# Patient Record
Sex: Female | Born: 1980 | Hispanic: Yes | Marital: Single | Smoking: Former smoker
Health system: Southern US, Community
[De-identification: ages and names within clinical notes are randomized; demographics above are authoritative.]

## PROBLEM LIST (undated history)

## (undated) ENCOUNTER — Inpatient Hospital Stay (HOSPITAL_COMMUNITY): Payer: Self-pay

## (undated) DIAGNOSIS — R002 Palpitations: Secondary | ICD-10-CM

## (undated) DIAGNOSIS — N200 Calculus of kidney: Secondary | ICD-10-CM

## (undated) DIAGNOSIS — O26839 Pregnancy related renal disease, unspecified trimester: Secondary | ICD-10-CM

## (undated) HISTORY — PX: OTHER SURGICAL HISTORY: SHX169

## (undated) HISTORY — PX: DILATION AND CURETTAGE OF UTERUS: SHX78

---

## 2013-05-19 ENCOUNTER — Emergency Department (HOSPITAL_COMMUNITY)
Admission: EM | Admit: 2013-05-19 | Discharge: 2013-05-19 | Disposition: A | Discharge status: Home / Self Care | Payer: Medicaid Other | Source: Home / Self Care | Attending: Family Medicine | Admitting: Family Medicine

## 2013-05-19 ENCOUNTER — Encounter (HOSPITAL_COMMUNITY): Payer: Self-pay | Admitting: Emergency Medicine

## 2013-05-19 DIAGNOSIS — B356 Tinea cruris: Secondary | ICD-10-CM

## 2013-05-19 MED ORDER — CLOTRIMAZOLE-BETAMETHASONE 1-0.05 % EX CREA: TOPICAL_CREAM | CUTANEOUS | Status: DC

## 2013-05-19 NOTE — ED Notes (Signed)
Pt c/o rash/blisters in groin area/inner thighs onset this am Reports it is painful/burning to walk and feels like blisters are "raw" Recalls bathing in baby oil last night... Denies: f/v/n/d She is alert w/no signs of acute distress.

## 2013-05-19 NOTE — ED Provider Notes (Signed)
CSN: [id number]     Arrival date & time 05/19/13  1624 History   First MD Initiated Contact with Patient 05/19/13 1802     Chief Complaint  Patient presents with  . Rash   (Consider location/radiation/quality/duration/timing/severity/associated sxs/prior Treatment) Patient is a 32 y.o. female presenting with rash. The history is provided by the patient.  Rash Location: bilateral groin folds. Quality: burning, itchiness, redness and weeping   Quality: not blistering and not draining   Severity:  Moderate Onset quality:  Gradual Duration:  2 days Timing:  Constant Progression:  Worsening Chronicity:  New Ineffective treatments:  Anti-itch cream   History reviewed. No pertinent past medical history. Past Surgical History  Procedure Laterality Date  . Cesarean section     No family history on file. History  Substance Use Topics  . Smoking status: Current Every Day Smoker -- 1.00 packs/day    Types: Cigarettes  . Smokeless tobacco: Not on file  . Alcohol Use: No   OB History   Grav Para Term Preterm Abortions TAB SAB Ect Mult Living                 Review of Systems  Skin: Positive for rash.  All other systems reviewed and are negative.    Allergies  Penicillins  Home Medications   Current Outpatient Rx  Name  Route  Sig  Dispense  Refill  . clotrimazole-betamethasone (LOTRISONE) cream      Apply to affected area 2 times daily x 7 days   15 g   0    BP 108/81  Pulse 86  Temp(Src) 97.8 F (36.6 C) (Oral)  Resp 18  SpO2 97% Physical Exam  Nursing note and vitals reviewed. Constitutional: She is oriented to person, place, and time. She appears well-developed and well-nourished. No distress.  HENT:  Head: Normocephalic and atraumatic.  Eyes: Conjunctivae are normal.  Neck: Normal range of motion.  Cardiovascular: Normal rate.   Pulmonary/Chest: Effort normal.  Genitourinary: Vagina normal.  Musculoskeletal: Normal range of motion.  Neurological:  She is alert and oriented to person, place, and time.  Skin: Skin is warm and dry. Rash noted. There is erythema.  Erythematous macular confluent rash in bilateral groin folds and perineal area. Mild swelling. No blistering.   Psychiatric: She has a normal mood and affect. Her behavior is normal.    ED Course  Procedures (including critical care time) Labs Review Labs Reviewed - No data to display Imaging Review No results found.  EKG Interpretation    Date/Time:    Ventricular Rate:    PR Interval:    QRS Duration:   QT Interval:    QTC Calculation:   R Axis:     Text Interpretation:              MDM   1. Tinea cruris   Exam suggests tinea cruris. Advised to keep area as clean and dry as possible. Begin treating with Lotrisone and return if no improvement.    Jess Barters [place], [place] 05/19/13 [phone]

## 2013-05-20 NOTE — ED Provider Notes (Signed)
Medical screening examination/treatment/procedure(s) were performed by resident physician or non-physician practitioner and as supervising physician I was immediately available for consultation/collaboration.   Ketzia Guzek DOUGLAS MD.   Royetta Probus D Hersel Mcmeen, MD 05/20/13 1703 

## 2013-08-14 ENCOUNTER — Encounter (HOSPITAL_COMMUNITY): Payer: Self-pay | Admitting: Emergency Medicine

## 2013-08-14 ENCOUNTER — Emergency Department (HOSPITAL_COMMUNITY): Payer: Medicaid Other

## 2013-08-14 DIAGNOSIS — Y9389 Activity, other specified: Secondary | ICD-10-CM | POA: Insufficient documentation

## 2013-08-14 DIAGNOSIS — F17200 Nicotine dependence, unspecified, uncomplicated: Secondary | ICD-10-CM | POA: Insufficient documentation

## 2013-08-14 DIAGNOSIS — R209 Unspecified disturbances of skin sensation: Secondary | ICD-10-CM | POA: Insufficient documentation

## 2013-08-14 DIAGNOSIS — S93519A Sprain of interphalangeal joint of unspecified toe(s), initial encounter: Secondary | ICD-10-CM | POA: Insufficient documentation

## 2013-08-14 DIAGNOSIS — S90129A Contusion of unspecified lesser toe(s) without damage to nail, initial encounter: Secondary | ICD-10-CM | POA: Insufficient documentation

## 2013-08-14 DIAGNOSIS — Z880 Allergy status to penicillin: Secondary | ICD-10-CM | POA: Insufficient documentation

## 2013-08-14 DIAGNOSIS — W2209XA Striking against other stationary object, initial encounter: Secondary | ICD-10-CM | POA: Insufficient documentation

## 2013-08-14 DIAGNOSIS — Y929 Unspecified place or not applicable: Secondary | ICD-10-CM | POA: Insufficient documentation

## 2013-08-14 NOTE — ED Notes (Signed)
Pt. accidentally hit her right 2nd toe against an aluminum pole while sliding at child's birthday party this evening . Presents with pain /slight swelling of right 2nd toe.

## 2013-08-15 ENCOUNTER — Emergency Department (HOSPITAL_COMMUNITY)
Admission: EM | Admit: 2013-08-15 | Discharge: 2013-08-15 | Disposition: A | Discharge status: Home / Self Care | Payer: Medicaid Other | Attending: Emergency Medicine | Admitting: Emergency Medicine

## 2013-08-15 DIAGNOSIS — S93514A Sprain of interphalangeal joint of right lesser toe(s), initial encounter: Secondary | ICD-10-CM

## 2013-08-15 MED ORDER — HYDROCODONE-ACETAMINOPHEN 5-325 MG PO TABS: 1.0000 | ORAL_TABLET | Freq: Four times a day (QID) | ORAL | Status: DC | PRN

## 2013-08-15 MED ORDER — IBUPROFEN 800 MG PO TABS: 800.0000 mg | ORAL_TABLET | Freq: Three times a day (TID) | ORAL | Status: DC

## 2013-08-15 NOTE — ED Notes (Signed)
[place], PA at bedside 

## 2013-08-15 NOTE — Discharge Instructions (Signed)
Sprain A sprain is an injury to the soft tissue that connects adjacent bones across a joint (ligament), in which the ligament becomes stretched or torn. The purpose of ligaments is to prevent a joint from moving out side of its intended range of motion. The most common joints of the body to suffer from a sprain are the ankles, knees, and fingers. Sprains are classified into 3 categories: grade 1, grade 2, and grade 3. Grade 1 sprains cause pain, but the tendon is not lengthened. Grade 2 sprains include a lengthened ligament due to the ligament being stretched or partially ruptured. With grade 2 sprains there is still function, although the function may be diminished. Grade 3 sprains are marked by a complete tear of the ligament and the joint usually displays a loss of function.  SYMPTOMS   Pain and tenderness in the area of injury; severity varies with extent of injury.  Swelling of the affected joint (usually).  Redness or bruising in the area of injury, either immediately or several hours after the injury.  Loss of normal mobility of the injured joint. CAUSES  A sprain may occur as a secondary injury to a traumatic event, such as a fall or twisting injury. The ankle is susceptible to sprains because of it is a mechanically weak joint and is exposed during athletic events. RISK INCREASES WITH:  Trauma, especially with high-risk activities, such as sports with a lot of jumping, for knee and ankle sprains (basketball or volleyball); sports with a lot of pivoting motions, for knee sprains (skiing, soccer, or football); and contact sports.  Falls onto outstretched hands and wrists (wrist sprains).  Catching sports, such as water polo and baseball (finger sprains).  Poorly fitting and high-heeled shoes.  Poor field conditions.  Poor strength and flexibility.  Failure to warm-up properly before activity. PREVENTION  Warm up and stretch properly before activity.  Maintain physical  fitness:  Muscle strength.  Endurance and flexibility.  Cardiovascular fitness.  Wear properly fitted and padded protective equipment.  Wrap weak joints with support bandages before strenuous activity. PROGNOSIS  If treated properly, sprains usually heal in 2 to 8 weeks. Occasionally sprains require surgery for healing to occur. RELATED COMPLICATIONS  Permanent instability of a joint if the sprain is severe or if a ligament is repeatedly sprained.  Arthritis of the joint. TREATMENT Treatment involves ice and medicine to relieve pain and inflammation. Rest and immobilization of the injured joint is necessary for healing to occur. Strengthening and stretching exercises may be recommended after immobilization to regain strength and a full range of motion. For severe sprains surgery may be necessary to repair the injured ligament. MEDICATION  If pain medicine is necessary, then nonsteroidal anti-inflammatory medicines, such as aspirin and ibuprofen, or other minor pain relievers, such as acetaminophen, are often recommended.  Do not take pain medicine for 7 days before surgery.  Prescription pain relievers may be prescribed. Use only as directed and only as much as you need.  Cortisone injections are generally not advised for sprains. Cortisone may affect the healing of the ligament. HEAT AND COLD  Cold treatment (icing) relieves pain and reduces inflammation. Cold treatment should be applied for 10 to 15 minutes every 2 to 3 hours for inflammation and pain and immediately after any activity that aggravates your symptoms. Use ice packs or massage the area with a piece of ice (ice massage).  Heat treatment may be used prior to performing the stretching and strengthening activities prescribed by your   caregiver, physical therapist, or athletic trainer. Use a heat pack or soak the injury in warm water. SEEK MEDICAL CARE IF:  Symptoms get worse or do not improve in 2 to 6 weeks despite  treatment. Document Released: 05/19/2005 Document Revised: 08/11/2011 Document Reviewed: 08/31/2008 Exit[employer] Patient Information 2014 [employer].  

## 2013-08-15 NOTE — ED Provider Notes (Signed)
CSN: [id number]     Arrival date & time 08/14/13  2250 History   First MD Initiated Contact with Patient 08/15/13 0030     Chief Complaint  Patient presents with  . Toe Injury     (Consider location/radiation/quality/duration/timing/severity/associated sxs/prior Treatment) The history is provided by the patient. No language interpreter was used.    33 year old female presents for evaluation of toe injury.  Pt report she accidentally hits her right 2nd toe against an aluminum pole while sliding at child's birthday party this evening.  Report acute onset of sharp throbbing, non radiating pain to affected toe, worsening with palpation or walking.  She took some ibuprofen prior to arrival which provides minimal relief. Pain is now 8/10.  No ankle or knee pain, no other injury.    History reviewed. No pertinent past medical history. Past Surgical History  Procedure Laterality Date  . Cesarean section     No family history on file. History  Substance Use Topics  . Smoking status: Current Every Day Smoker -- 1.00 packs/day    Types: Cigarettes  . Smokeless tobacco: Not on file  . Alcohol Use: No   OB History   Grav Para Term Preterm Abortions TAB SAB Ect Mult Living                 Review of Systems  Constitutional: Negative for fever.  Musculoskeletal: Positive for arthralgias.  Skin: Negative for rash and wound.  Neurological: Positive for numbness.      Allergies  Penicillins  Home Medications   Current Outpatient Rx  Name  Route  Sig  Dispense  Refill  . clotrimazole-betamethasone (LOTRISONE) cream      Apply to affected area 2 times daily x 7 days   15 g   0    BP 96/76  Pulse 106  Temp(Src) 98.4 F (36.9 C) (Oral)  Resp 18  SpO2 99% Physical Exam  Nursing note and vitals reviewed. Constitutional: She appears well-developed and well-nourished. No distress.  HENT:  Head: Atraumatic.  Eyes: Conjunctivae are normal.  Neck: Neck supple.  Musculoskeletal:  She exhibits tenderness (R foot: 2nd toe moderately tender to palpation, small hematoma and swelling noted at PIP, brisk cap refill, no deformity.  ttp).  R ankle with FROM, nontender  Neurological: She is alert.  Skin: No rash noted.  Psychiatric: She has a normal mood and affect.    ED Course  Procedures (including critical care time)  12:43 AM Pt injured right 2nd toe.  No acute fx on xray.  Likely sprain.  Will provide postop shoe, RICE and return precaution  Labs Review Labs Reviewed - No data to display Imaging Review Dg Toe 2nd Right  08/14/2013   CLINICAL DATA:  Injury.  Right second toe pain.  EXAM: RIGHT SECOND TOE  COMPARISON:  None.  FINDINGS: Imaged bones, joints and soft tissues appear normal.  IMPRESSION: Negative exam.   Electronically Signed   By: Drusilla Kannerhomas  Dalessio M.D.   On: 08/14/2013 23:29     EKG Interpretation None      MDM   Final diagnoses:  Sprain, IP, toe, second, right    BP 96/76  Pulse 106  Temp(Src) 98.4 F (36.9 C) (Oral)  Resp 18  SpO2 99%  I have reviewed nursing notes and vital signs. I personally reviewed the imaging tests through PACS system  I reviewed available ER/hospitalization records thought the EMR     Fayrene HelperBowie Tanicia Wolaver, [place] 08/15/13 [phone]

## 2013-08-18 NOTE — ED Provider Notes (Signed)
Medical screening examination/treatment/procedure(s) were performed by non-physician practitioner and as supervising physician I was immediately available for consultation/collaboration.   EKG Interpretation None       Zahriyah Joo, MD 08/18/13 0236 

## 2014-06-26 ENCOUNTER — Emergency Department (HOSPITAL_COMMUNITY)
Admission: EM | Admit: 2014-06-26 | Discharge: 2014-06-26 | Disposition: A | Discharge status: Home / Self Care | Payer: Medicaid Other | Source: Home / Self Care | Attending: Emergency Medicine | Admitting: Emergency Medicine

## 2014-06-26 ENCOUNTER — Encounter (HOSPITAL_COMMUNITY): Payer: Self-pay | Admitting: Emergency Medicine

## 2014-06-26 DIAGNOSIS — J209 Acute bronchitis, unspecified: Secondary | ICD-10-CM | POA: Diagnosis not present

## 2014-06-26 DIAGNOSIS — M545 Low back pain, unspecified: Secondary | ICD-10-CM

## 2014-06-26 MED ORDER — KETOROLAC TROMETHAMINE 60 MG/2ML IM SOLN
60.0000 mg | Freq: Once | INTRAMUSCULAR | Status: AC
  Administered 2014-06-26: 60 mg via INTRAMUSCULAR

## 2014-06-26 MED ORDER — HYDROCODONE-ACETAMINOPHEN 5-325 MG PO TABS: 1.0000 | ORAL_TABLET | Freq: Four times a day (QID) | ORAL | Status: DC | PRN

## 2014-06-26 MED ORDER — PREDNISONE 50 MG PO TABS
ORAL_TABLET | ORAL | Status: DC
Start: 2014-06-26 — End: 2015-10-23

## 2014-06-26 MED ORDER — KETOROLAC TROMETHAMINE 60 MG/2ML IM SOLN
INTRAMUSCULAR | Status: AC
  Filled 2014-06-26: qty 2

## 2014-06-26 MED ORDER — METHYLPREDNISOLONE SODIUM SUCC 125 MG IJ SOLR
80.0000 mg | Freq: Once | INTRAMUSCULAR | Status: AC
  Administered 2014-06-26: 80 mg via INTRAMUSCULAR

## 2014-06-26 MED ORDER — METHYLPREDNISOLONE SODIUM SUCC 125 MG IJ SOLR
INTRAMUSCULAR | Status: AC
Start: 2014-06-26 — End: 2014-06-26
  Filled 2014-06-26: qty 2

## 2014-06-26 MED ORDER — IBUPROFEN 800 MG PO TABS: 800.0000 mg | ORAL_TABLET | Freq: Three times a day (TID) | ORAL | Status: DC

## 2014-06-26 MED ORDER — AZITHROMYCIN 250 MG PO TABS: ORAL_TABLET | ORAL | Status: DC

## 2014-06-26 NOTE — ED Provider Notes (Signed)
CSN: [id number]     Arrival date & time 06/26/14  1139 History   First MD Initiated Contact with Patient 06/26/14 1345     Chief Complaint  Patient presents with  . Chest Pain  . Cough   (Consider location/radiation/quality/duration/timing/severity/associated sxs/prior Treatment) HPI  She is a 34 year old woman here for evaluation of cough and low back pain.  The cough started 2 days ago. It is associated with some mild nasal congestion. She reports pain and tightness throughout her chest with the cough. She will get slightly short of breath with talking. No fevers or chills.  She is having low back pain for the last 2 weeks or so. She states it initially started in her lateral hip 1-2 months ago. That seems to is improved, but now she has pain bilateral lower back. It is worse on going from sitting to standing. It feels the best when she is standing up straight. There is no radiating pain. No numbness, tingling, weakness in her lower extremities. No bowel or bladder incontinence.  History reviewed. No pertinent past medical history. Past Surgical History  Procedure Laterality Date  . Cesarean section     No family history on file. History  Substance Use Topics  . Smoking status: Current Every Day Smoker -- 1.00 packs/day    Types: Cigarettes  . Smokeless tobacco: Not on file  . Alcohol Use: Yes   OB History    No data available     Review of Systems As in history of present illness Allergies  Penicillins  Home Medications   Prior to Admission medications   Medication Sig Start Date End Date Taking? Authorizing Provider  azithromycin (ZITHROMAX Z-PAK) 250 MG tablet Take 2 pills today, then 1 pill daily until gone 06/26/14   Charm RingsErin J Kaelyn Nauta, MD  clotrimazole-betamethasone (LOTRISONE) cream Apply to affected area 2 times daily x 7 days 05/19/13   Ria ClockJennifer Lee H Presson, PA  HYDROcodone-acetaminophen (NORCO/VICODIN) 5-325 MG per tablet Take 1 tablet by mouth every 6 (six) hours  as needed for severe pain. 06/26/14   Charm RingsErin J Damara Klunder, MD  ibuprofen (ADVIL,MOTRIN) 800 MG tablet Take 1 tablet (800 mg total) by mouth 3 (three) times daily. 06/26/14   Charm RingsErin J Rubyann Lingle, MD  predniSONE (DELTASONE) 50 MG tablet Take 1 pill daily for 5 days 06/26/14   Charm RingsErin J Eswin Worrell, MD   BP 112/82 mmHg  Pulse 81  Temp(Src) 98.8 F (37.1 C) (Oral)  Resp 16  SpO2 97% Physical Exam  Constitutional: She is oriented to person, place, and time. She appears well-developed and well-nourished. She appears distressed.  Neck: Neck supple.  Cardiovascular: Normal rate, regular rhythm and normal heart sounds.   No murmur heard. Pulmonary/Chest: Effort normal and breath sounds normal. No respiratory distress. She has no wheezes. She has no rales. She exhibits tenderness.  Lymphadenopathy:    She has no cervical adenopathy.  Neurological: She is alert and oriented to person, place, and time. She exhibits normal muscle tone.  Back: No erythema or edema. No vertebral step-offs. No vertebral tenderness. She is tender over bilateral SI joints, worse on the right. She is also tender over bilateral greater trochanters. Positive Faber bilaterally. Negative straight leg raise bilaterally. 5 out of 5 strength in bilateral lower extremities.    ED Course  Procedures (including critical care time) Labs Review Labs Reviewed - No data to display  Imaging Review No results found.   MDM   1. Acute bronchitis, unspecified organism   2.  Bilateral low back pain without sciatica    Toradol 60 mg IM and Solu-Medrol 80 mg IM given for pain.  We'll treat bronchitis with azithromycin and prednisone. We'll treat back pain conservatively with ice/heat, ibuprofen, Norco. Follow-up as needed.    Charm Rings, MD 06/26/14 [phone]

## 2014-06-26 NOTE — ED Notes (Signed)
Chest pain with coughing for 2 days.  Coughing up phlegm, congestion.  Symptoms for 2 days Lower back pain 2 weeks ago, now reports pain is moving up spine.  Reports going form sitting to standing causes extreme pain in lower back

## 2014-06-26 NOTE — Discharge Instructions (Signed)
You have bronchitis. Take prednisone and a Z-Pak as prescribed.  Your back pain is likely from SI joint inflammation. The prednisone will help with this. Take ibuprofen 800 mg 3 times a day for the next week, then as needed. Apply ice to the affected areas for 10 minutes, followed by heat for 20 minutes. Do this 2-3 times a day for the next week. Use hydrocodone on every 4 hours as needed for severe pain. Do not take this medication while driving. You can try a chiropractor next week.  Follow-up as needed if your symptoms change or worsen.

## 2014-07-02 ENCOUNTER — Emergency Department (HOSPITAL_COMMUNITY): Payer: Medicaid Other

## 2014-07-02 ENCOUNTER — Encounter (HOSPITAL_COMMUNITY): Payer: Self-pay | Admitting: Physical Medicine and Rehabilitation

## 2014-07-02 ENCOUNTER — Emergency Department (HOSPITAL_COMMUNITY)
Admission: EM | Admit: 2014-07-02 | Discharge: 2014-07-02 | Disposition: A | Discharge status: Home / Self Care | Payer: Medicaid Other | Attending: Emergency Medicine | Admitting: Emergency Medicine

## 2014-07-02 DIAGNOSIS — J209 Acute bronchitis, unspecified: Secondary | ICD-10-CM | POA: Insufficient documentation

## 2014-07-02 DIAGNOSIS — Z880 Allergy status to penicillin: Secondary | ICD-10-CM | POA: Insufficient documentation

## 2014-07-02 DIAGNOSIS — Z791 Long term (current) use of non-steroidal anti-inflammatories (NSAID): Secondary | ICD-10-CM | POA: Insufficient documentation

## 2014-07-02 DIAGNOSIS — Z720 Tobacco use: Secondary | ICD-10-CM | POA: Diagnosis not present

## 2014-07-02 DIAGNOSIS — R05 Cough: Secondary | ICD-10-CM | POA: Diagnosis present

## 2014-07-02 MED ORDER — BENZONATATE 100 MG PO CAPS: 100.0000 mg | ORAL_CAPSULE | Freq: Three times a day (TID) | ORAL | Status: DC

## 2014-07-02 MED ORDER — HYDROCOD POLST-CHLORPHEN POLST 10-8 MG/5ML PO LQCR
5.0000 mL | Freq: Once | ORAL | Status: AC
  Administered 2014-07-02: 5 mL via ORAL
  Filled 2014-07-02: qty 5

## 2014-07-02 MED ORDER — ALBUTEROL SULFATE HFA 108 (90 BASE) MCG/ACT IN AERS
2.0000 | INHALATION_SPRAY | RESPIRATORY_TRACT | Status: DC | PRN
  Administered 2014-07-02: 2 via RESPIRATORY_TRACT
  Filled 2014-07-02: qty 6.7

## 2014-07-02 MED ORDER — HYDROCOD POLST-CHLORPHEN POLST 10-8 MG/5ML PO LQCR: 5.0000 mL | Freq: Two times a day (BID) | ORAL | Status: DC | PRN

## 2014-07-02 NOTE — ED Notes (Signed)
Declined W/C at D/C and was escorted to lobby by RN. 

## 2014-07-02 NOTE — ED Provider Notes (Signed)
CSN: [id number]     Arrival date & time 07/02/14  1247 History   First MD Initiated Contact with Patient 07/02/14 1355     Chief Complaint  Patient presents with  . Cough   (Consider location/radiation/quality/duration/timing/severity/associated sxs/prior Treatment) HPI Comments: Patient presents with c/o bronchitis and cough x 1 week. She was seen at [facility] on 06/26/14 and was given solumedrol, azithromycin d/c to home with hydrocodone and ibuprofen. She has taken all medications as of two days ago. She continues to have cough, wheezing, chills, nasal congestion. She has cough paroxysms in morning and at night and feels nauseous. No vomiting. She has back and chest soreness with coughing. Patient denies risk factors for pulmonary embolism including: unilateral leg swelling, history of DVT/PE/other blood clots, use of estrogens, recent immobilizations, recent surgery, recent travel (>4hr segment), malignancy, hemoptysis.     The history is provided by the patient.    History reviewed. No pertinent past medical history. Past Surgical History  Procedure Laterality Date  . Cesarean section     No family history on file. History  Substance Use Topics  . Smoking status: Current Every Day Smoker -- 1.00 packs/day    Types: Cigarettes  . Smokeless tobacco: Not on file  . Alcohol Use: Yes   OB History    No data available     Review of Systems  Constitutional: Positive for chills and fatigue. Negative for fever.  HENT: Negative for congestion, ear pain, rhinorrhea, sinus pressure and sore throat.   Eyes: Negative for redness.  Respiratory: Positive for cough and wheezing.   Gastrointestinal: Negative for nausea, vomiting, abdominal pain and diarrhea.  Genitourinary: Negative for dysuria.  Musculoskeletal: Positive for myalgias. Negative for neck stiffness.  Skin: Negative for rash.  Neurological: Negative for headaches.  Hematological: Negative for adenopathy.    Allergies   Penicillins  Home Medications   Prior to Admission medications   Medication Sig Start Date End Date Taking? Authorizing Provider  azithromycin (ZITHROMAX Z-PAK) 250 MG tablet Take 2 pills today, then 1 pill daily until gone 06/26/14   Charm Rings, MD  clotrimazole-betamethasone (LOTRISONE) cream Apply to affected area 2 times daily x 7 days 05/19/13   Ria Clock, PA  HYDROcodone-acetaminophen (NORCO/VICODIN) 5-325 MG per tablet Take 1 tablet by mouth every 6 (six) hours as needed for severe pain. 06/26/14   Charm Rings, MD  ibuprofen (ADVIL,MOTRIN) 800 MG tablet Take 1 tablet (800 mg total) by mouth 3 (three) times daily. 06/26/14   Charm Rings, MD  predniSONE (DELTASONE) 50 MG tablet Take 1 pill daily for 5 days 06/26/14   Charm Rings, MD   BP 124/67 mmHg  Pulse 84  Temp(Src) 98.1 F (36.7 C)  Resp 18  Ht  (1.6 m)  Wt 165 lb (74.844 kg)  BMI 29.24 kg/m2  SpO2 99%   Physical Exam  Constitutional: She appears well-developed and well-nourished.  HENT:  Head: Normocephalic and atraumatic.  Right Ear: Tympanic membrane, external ear and ear canal normal.  Left Ear: Tympanic membrane, external ear and ear canal normal.  Nose: Rhinorrhea present. No mucosal edema.  Mouth/Throat: Uvula is midline, oropharynx is clear and moist and mucous membranes are normal.  Eyes: Conjunctivae are normal. Right eye exhibits no discharge. Left eye exhibits no discharge.  Neck: Normal range of motion. Neck supple.  Cardiovascular: Normal rate, regular rhythm and normal heart sounds.   No murmur heard. Pulmonary/Chest: Effort normal. No respiratory distress. She  has wheezes (mild, expiratory, scattered). She has no rales.  Abdominal: Soft. There is no tenderness. There is no rebound and no guarding.  Lymphadenopathy:    She has no cervical adenopathy.  Neurological: She is alert.  Skin: Skin is warm and dry.  Psychiatric: She has a normal mood and affect.  Nursing note and vitals  reviewed.   ED Course  Procedures (including critical care time) Labs Review Labs Reviewed - No data to display  Imaging Review Dg Chest 2 View  07/02/2014   CLINICAL DATA:  Cough and congestion for 2 weeks. Recent diagnosis of bronchitis.  EXAM: CHEST  2 VIEW  COMPARISON:  None.  FINDINGS: Heart size and mediastinal contours are within normal limits. Both lungs are clear. Visualized skeletal structures are unremarkable.  IMPRESSION: Negative exam.   Electronically Signed   By: Drusilla Kannerhomas  Dalessio M.D.   On: 07/02/2014 13:42     EKG Interpretation None       2:04 PM Patient seen and examined. Informed of x-ray results. Will treat with Tussionex, Tessalon albuterol inhaler. Patient to take over-the-counter NSAIDs at home.  Vital signs reviewed and are as follows: BP 124/67 mmHg  Pulse 84  Temp(Src) 98.1 F (36.7 C)  Resp 18  Ht 5\' 3"  (1.6 m)  Wt 165 lb (74.844 kg)  BMI 29.24 kg/m2  SpO2 99%  Patient urged to return with worsening symptoms or other concerns. Patient verbalized understanding and agrees with plan.   2:20 PM Patient counseled on use of narcotic cough medications. Counseled not to combine these medications with others containing tylenol. Urged not to drink alcohol, drive, or perform any other activities that requires focus while taking these medications. The patient verbalizes understanding and agrees with the plan.    MDM   Final diagnoses:  Acute bronchitis, unspecified organism   Pt with acute bronchitis, unrelieved with antibiotics, steroids. I do not suspect that this is bacterial. Chest x-ray does not show pneumonia. Vital signs are stable. Will give cough suppressants, albuterol, continue NSAIDs. Patient appears well, nontoxic. Counseled on the typical course of bronchitis.   Renne CriglerJoshua Carel Schnee, PA-C 07/02/14 1443  Gerhard Munchobert Lockwood, MD 07/02/14 [phone]

## 2014-07-02 NOTE — Discharge Instructions (Signed)
Please read and follow all provided instructions.  Your diagnoses today include:  1. Acute bronchitis, unspecified organism    Tests performed today include:  Chest x-ray - does not show any pneumonia  Vital signs. See below for your results today.   Medications prescribed:   Tessalon Perles - cough suppressant medication   Tussinex - narcotic cough suppressant syrup  You have been prescribed narcotic cough suppressant such as Tussinex: DO NOT drive or perform any activities that require you to be awake and alert because this medicine can make you drowsy.    Albuterol inhaler - medication that opens up your airway  Use inhaler as follows: 1-2 puffs with spacer every 4 hours as needed for wheezing, cough, or shortness of breath.   Take any prescribed medications only as directed.  Home care instructions:  Follow any educational materials contained in this packet.  Follow-up instructions: Please follow-up with your primary care provider in the next 3 days for further evaluation of your symptoms and a recheck if you are not feeling better.   Return instructions:   Please return to the Emergency Department if you experience worsening symptoms.  Please return with worsening wheezing, shortness of breath, or difficulty breathing.  Return with persistent fever above 101F.   Please return if you have any other emergent concerns.  Additional Information:  Your vital signs today were: BP 124/67 mmHg   Pulse 84   Temp(Src) 98.1 F (36.7 C)   Resp 18   Ht 5\' 3"  (1.6 m)   Wt 165 lb (74.844 kg)   BMI 29.24 kg/m2   SpO2 99% If your blood pressure (BP) was elevated above 135/85 this visit, please have this repeated by your doctor within one month. --------------

## 2014-07-02 NOTE — ED Notes (Signed)
Pt reports productive cough with yellow colored sputum, chest congestion, fatigue and body aches. Respirations unlabored. Speaking complete sentences. Pt is alert and oriented x4. States she took azithromycin and prednisone with no relief.

## 2015-02-04 ENCOUNTER — Encounter (HOSPITAL_COMMUNITY): Payer: Self-pay | Admitting: *Deleted

## 2015-02-04 ENCOUNTER — Other Ambulatory Visit (HOSPITAL_COMMUNITY)
Admission: RE | Admit: 2015-02-04 | Discharge: 2015-02-04 | Disposition: A | Discharge status: Home / Self Care | Payer: Medicaid Other | Source: Ambulatory Visit | Attending: Family Medicine | Admitting: Family Medicine

## 2015-02-04 ENCOUNTER — Emergency Department (HOSPITAL_COMMUNITY)
Admission: EM | Admit: 2015-02-04 | Discharge: 2015-02-04 | Disposition: A | Discharge status: Home / Self Care | Payer: Self-pay | Source: Home / Self Care | Attending: Family Medicine | Admitting: Family Medicine

## 2015-02-04 DIAGNOSIS — A499 Bacterial infection, unspecified: Secondary | ICD-10-CM

## 2015-02-04 DIAGNOSIS — N760 Acute vaginitis: Secondary | ICD-10-CM | POA: Insufficient documentation

## 2015-02-04 DIAGNOSIS — B9689 Other specified bacterial agents as the cause of diseases classified elsewhere: Secondary | ICD-10-CM

## 2015-02-04 DIAGNOSIS — Z113 Encounter for screening for infections with a predominantly sexual mode of transmission: Secondary | ICD-10-CM | POA: Diagnosis not present

## 2015-02-04 LAB — POCT URINALYSIS DIP (DEVICE)
Bilirubin Urine: NEGATIVE
GLUCOSE, UA: NEGATIVE mg/dL
KETONES UR: NEGATIVE mg/dL
LEUKOCYTES UA: NEGATIVE
Nitrite: NEGATIVE
PROTEIN: NEGATIVE mg/dL
Specific Gravity, Urine: 1.02 (ref 1.005–1.030)
Urobilinogen, UA: 1 mg/dL (ref 0.0–1.0)
pH: 7 (ref 5.0–8.0)

## 2015-02-04 LAB — POCT PREGNANCY, URINE: PREG TEST UR: NEGATIVE

## 2015-02-04 MED ORDER — METRONIDAZOLE 500 MG PO TABS: 500.0000 mg | ORAL_TABLET | Freq: Two times a day (BID) | ORAL | Status: DC

## 2015-02-04 NOTE — ED Provider Notes (Addendum)
CSN: [id number]     Arrival date & time 02/04/15  1854 History   First MD Initiated Contact with Patient 02/04/15 1910     Chief Complaint  Patient presents with  . Vaginal Discharge   (Consider location/radiation/quality/duration/timing/severity/associated sxs/prior Treatment) Patient is a 34 y.o. female presenting with vaginal discharge. The history is provided by the patient.  Vaginal Discharge Quality:  Malodorous, watery and thin Severity:  Moderate Onset quality:  Gradual Duration:  1 week Progression:  Unchanged Chronicity:  New Context: spontaneously   Relieved by:  None tried Worsened by:  Nothing tried Ineffective treatments:  None tried Associated symptoms: no fever, no genital lesions, no nausea, no urinary incontinence and no vomiting   Risk factors: unprotected sex     History reviewed. No pertinent past medical history. Past Surgical History  Procedure Laterality Date  . Cesarean section     History reviewed. No pertinent family history. Social History  Substance Use Topics  . Smoking status: Current Every Day Smoker -- 1.00 packs/day    Types: Cigarettes  . Smokeless tobacco: None  . Alcohol Use: Yes   OB History    No data available     Review of Systems  Constitutional: Negative.  Negative for fever.  Gastrointestinal: Negative for nausea and vomiting.  Genitourinary: Positive for urgency, vaginal discharge and pelvic pain. Negative for bladder incontinence, hematuria and flank pain.  All other systems reviewed and are negative.   Allergies  Penicillins  Home Medications   Prior to Admission medications   Medication Sig Start Date End Date Taking? Authorizing Provider  azithromycin (ZITHROMAX Z-PAK) 250 MG tablet Take 2 pills today, then 1 pill daily until gone 06/26/14   Charm Rings, MD  benzonatate (TESSALON) 100 MG capsule Take 1 capsule (100 mg total) by mouth every 8 (eight) hours. 07/02/14   Renne Crigler, PA-C   chlorpheniramine-HYDROcodone (TUSSIONEX PENNKINETIC ER) 10-8 MG/5ML LQCR Take 5 mLs by mouth every 12 (twelve) hours as needed for cough. 07/02/14   Renne Crigler, PA-C  clotrimazole-betamethasone (LOTRISONE) cream Apply to affected area 2 times daily x 7 days 05/19/13   Ria Clock, PA  HYDROcodone-acetaminophen (NORCO/VICODIN) 5-325 MG per tablet Take 1 tablet by mouth every 6 (six) hours as needed for severe pain. 06/26/14   Charm Rings, MD  ibuprofen (ADVIL,MOTRIN) 800 MG tablet Take 1 tablet (800 mg total) by mouth 3 (three) times daily. 06/26/14   Charm Rings, MD  metroNIDAZOLE (FLAGYL) 500 MG tablet Take 1 tablet (500 mg total) by mouth 2 (two) times daily. 02/04/15   Linna Hoff, MD  predniSONE (DELTASONE) 50 MG tablet Take 1 pill daily for 5 days 06/26/14   Charm Rings, MD   Meds Ordered and Administered this Visit  Medications - No data to display  BP 119/76 mmHg  Pulse 58  Temp(Src) 98 F (36.7 C) (Oral)  Resp 18  SpO2 97% No data found.   Physical Exam  Constitutional: She is oriented to person, place, and time. She appears well-developed and well-nourished.  Abdominal: Soft. Bowel sounds are normal. There is tenderness.  Genitourinary: No erythema or bleeding in the vagina. No signs of injury around the vagina. Vaginal discharge found.  Neurological: She is alert and oriented to person, place, and time.  Skin: Skin is warm and dry.  Nursing note and vitals reviewed.   ED Course  Procedures (including critical care time)  Labs Review Labs Reviewed  POCT URINALYSIS DIP (DEVICE) -  Abnormal; Notable for the following:    Hgb urine dipstick TRACE (*)    All other components within normal limits  POCT PREGNANCY, URINE  CERVICOVAGINAL ANCILLARY ONLY    Imaging Review No results found.   Visual Acuity Review  Right Eye Distance:   Left Eye Distance:   Bilateral Distance:    Right Eye Near:   Left Eye Near:    Bilateral Near:         MDM    1. BV (bacterial vaginosis)        Linna Hoff, MD 02/04/15 1942  Linna Hoff, MD 02/04/15 1944

## 2015-02-04 NOTE — Discharge Instructions (Signed)
Use medicine as prescribed, we will call if tests show a need for other treatment.  

## 2015-02-04 NOTE — ED Notes (Signed)
Pt reports  Symptoms  Of  Vaginal  Discharge   With  Symptoms      For   Several  Days       Pt  Also reports      Symptoms  Of  Urinary    Discomfort  When  She  Voids  Pt wants  To  Be  Checked  For  An  STD

## 2015-02-06 LAB — CERVICOVAGINAL ANCILLARY ONLY
CHLAMYDIA, DNA PROBE: NEGATIVE
NEISSERIA GONORRHEA: NEGATIVE

## 2015-02-07 LAB — CERVICOVAGINAL ANCILLARY ONLY: Wet Prep (BD Affirm): POSITIVE — AB

## 2015-02-13 NOTE — ED Notes (Signed)
Final report of STD results negative for GC, chlamydia, positive for gardnerella, yeast. Called patient to discuss . Will complete Rx as written

## 2015-05-16 LAB — OB RESULTS CONSOLE RUBELLA ANTIBODY, IGM: Rubella: IMMUNE

## 2015-05-16 LAB — OB RESULTS CONSOLE GC/CHLAMYDIA
CHLAMYDIA, DNA PROBE: NEGATIVE
Gonorrhea: NEGATIVE

## 2015-05-16 LAB — OB RESULTS CONSOLE HGB/HCT, BLOOD
HEMATOCRIT: 42 %
Hemoglobin: 14.8 g/dL

## 2015-05-16 LAB — OB RESULTS CONSOLE PLATELET COUNT: PLATELETS: 230 10*3/uL

## 2015-05-16 LAB — OB RESULTS CONSOLE HEPATITIS B SURFACE ANTIGEN: Hepatitis B Surface Ag: NEGATIVE

## 2015-05-16 LAB — OB RESULTS CONSOLE HIV ANTIBODY (ROUTINE TESTING): HIV: NONREACTIVE

## 2015-05-18 ENCOUNTER — Other Ambulatory Visit: Payer: Self-pay | Admitting: Obstetrics and Gynecology

## 2015-05-18 DIAGNOSIS — N6320 Unspecified lump in the left breast, unspecified quadrant: Secondary | ICD-10-CM

## 2015-05-25 ENCOUNTER — Ambulatory Visit
Admission: RE | Admit: 2015-05-25 | Discharge: 2015-05-25 | Disposition: A | Discharge status: Home / Self Care | Payer: Medicaid Other | Source: Ambulatory Visit | Attending: Obstetrics and Gynecology | Admitting: Obstetrics and Gynecology

## 2015-05-25 DIAGNOSIS — N6320 Unspecified lump in the left breast, unspecified quadrant: Secondary | ICD-10-CM

## 2015-10-23 ENCOUNTER — Inpatient Hospital Stay (HOSPITAL_COMMUNITY): Payer: Medicaid Other

## 2015-10-23 ENCOUNTER — Observation Stay (HOSPITAL_COMMUNITY)
Admission: AD | Admit: 2015-10-23 | Discharge: 2015-10-24 | Disposition: A | Discharge status: Home / Self Care | Payer: Medicaid Other | Source: Ambulatory Visit | Attending: Obstetrics and Gynecology | Admitting: Obstetrics and Gynecology

## 2015-10-23 ENCOUNTER — Encounter (HOSPITAL_COMMUNITY): Payer: Self-pay | Admitting: *Deleted

## 2015-10-23 DIAGNOSIS — R531 Weakness: Secondary | ICD-10-CM | POA: Insufficient documentation

## 2015-10-23 DIAGNOSIS — O9989 Other specified diseases and conditions complicating pregnancy, childbirth and the puerperium: Secondary | ICD-10-CM | POA: Diagnosis present

## 2015-10-23 DIAGNOSIS — R300 Dysuria: Secondary | ICD-10-CM | POA: Diagnosis present

## 2015-10-23 DIAGNOSIS — N200 Calculus of kidney: Secondary | ICD-10-CM | POA: Diagnosis not present

## 2015-10-23 DIAGNOSIS — O26833 Pregnancy related renal disease, third trimester: Secondary | ICD-10-CM | POA: Diagnosis not present

## 2015-10-23 DIAGNOSIS — O99513 Diseases of the respiratory system complicating pregnancy, third trimester: Secondary | ICD-10-CM | POA: Diagnosis not present

## 2015-10-23 DIAGNOSIS — M549 Dorsalgia, unspecified: Secondary | ICD-10-CM | POA: Insufficient documentation

## 2015-10-23 DIAGNOSIS — O212 Late vomiting of pregnancy: Secondary | ICD-10-CM | POA: Insufficient documentation

## 2015-10-23 DIAGNOSIS — R110 Nausea: Secondary | ICD-10-CM | POA: Diagnosis present

## 2015-10-23 DIAGNOSIS — Z87891 Personal history of nicotine dependence: Secondary | ICD-10-CM | POA: Insufficient documentation

## 2015-10-23 DIAGNOSIS — Z3A32 32 weeks gestation of pregnancy: Secondary | ICD-10-CM | POA: Insufficient documentation

## 2015-10-23 DIAGNOSIS — R3915 Urgency of urination: Secondary | ICD-10-CM | POA: Insufficient documentation

## 2015-10-23 DIAGNOSIS — R319 Hematuria, unspecified: Secondary | ICD-10-CM | POA: Insufficient documentation

## 2015-10-23 DIAGNOSIS — R1084 Generalized abdominal pain: Secondary | ICD-10-CM

## 2015-10-23 DIAGNOSIS — R109 Unspecified abdominal pain: Secondary | ICD-10-CM

## 2015-10-23 DIAGNOSIS — J029 Acute pharyngitis, unspecified: Secondary | ICD-10-CM | POA: Diagnosis not present

## 2015-10-23 DIAGNOSIS — R350 Frequency of micturition: Secondary | ICD-10-CM | POA: Diagnosis not present

## 2015-10-23 DIAGNOSIS — O26899 Other specified pregnancy related conditions, unspecified trimester: Secondary | ICD-10-CM

## 2015-10-23 DIAGNOSIS — R112 Nausea with vomiting, unspecified: Secondary | ICD-10-CM

## 2015-10-23 HISTORY — DX: Palpitations: R00.2

## 2015-10-23 LAB — CBC WITH DIFFERENTIAL/PLATELET
BASOS ABS: 0 10*3/uL (ref 0.0–0.1)
BASOS PCT: 0 %
EOS PCT: 2 %
Eosinophils Absolute: 0.2 10*3/uL (ref 0.0–0.7)
HCT: 36.2 % (ref 36.0–46.0)
Hemoglobin: 12.5 g/dL (ref 12.0–15.0)
LYMPHS ABS: 2.5 10*3/uL (ref 0.7–4.0)
LYMPHS PCT: 21 %
MCH: 31 pg (ref 26.0–34.0)
MCHC: 34.5 g/dL (ref 30.0–36.0)
MCV: 89.8 fL (ref 78.0–100.0)
Monocytes Absolute: 0.7 10*3/uL (ref 0.1–1.0)
Monocytes Relative: 6 %
Neutro Abs: 8.5 10*3/uL — ABNORMAL HIGH (ref 1.7–7.7)
Neutrophils Relative %: 71 %
Other: 0 %
PLATELETS: 227 10*3/uL (ref 150–400)
RBC: 4.03 MIL/uL (ref 3.87–5.11)
RDW: 13.6 % (ref 11.5–15.5)
WBC: 11.9 10*3/uL — AB (ref 4.0–10.5)

## 2015-10-23 LAB — URINALYSIS, ROUTINE W REFLEX MICROSCOPIC
BILIRUBIN URINE: NEGATIVE
Glucose, UA: NEGATIVE mg/dL
Ketones, ur: NEGATIVE mg/dL
Leukocytes, UA: NEGATIVE
Nitrite: NEGATIVE
PH: 6.5 (ref 5.0–8.0)
Protein, ur: NEGATIVE mg/dL
Specific Gravity, Urine: 1.015 (ref 1.005–1.030)

## 2015-10-23 LAB — URINE MICROSCOPIC-ADD ON: WBC, UA: NONE SEEN WBC/hpf (ref 0–5)

## 2015-10-23 MED ORDER — HYDROMORPHONE HCL 1 MG/ML IJ SOLN: 1.0000 mg | INTRAMUSCULAR | Status: DC | PRN

## 2015-10-23 MED ORDER — HYDROMORPHONE HCL 1 MG/ML IJ SOLN
1.0000 mg | Freq: Once | INTRAMUSCULAR | Status: DC
  Filled 2015-10-23: qty 1

## 2015-10-23 MED ORDER — PHENAZOPYRIDINE HCL 100 MG PO TABS
200.0000 mg | ORAL_TABLET | Freq: Once | ORAL | Status: AC
  Administered 2015-10-23: 200 mg via ORAL
  Filled 2015-10-23: qty 2

## 2015-10-23 MED ORDER — LACTATED RINGERS IV SOLN
INTRAVENOUS | Status: DC
  Administered 2015-10-23: 22:00:00 via INTRAVENOUS
  Administered 2015-10-24: 125 mL/h via INTRAVENOUS

## 2015-10-23 MED ORDER — LACTATED RINGERS IV BOLUS (SEPSIS)
1000.0000 mL | Freq: Once | INTRAVENOUS | Status: AC
  Administered 2015-10-23: 1000 mL via INTRAVENOUS

## 2015-10-23 MED ORDER — PRENATAL MULTIVITAMIN CH
1.0000 | ORAL_TABLET | Freq: Every day | ORAL | Status: DC
  Administered 2015-10-24: 1 via ORAL
  Filled 2015-10-23 (×2): qty 1

## 2015-10-23 MED ORDER — ONDANSETRON HCL 4 MG/2ML IJ SOLN
4.0000 mg | Freq: Once | INTRAMUSCULAR | Status: AC
  Administered 2015-10-23: 4 mg via INTRAVENOUS
  Filled 2015-10-23: qty 2

## 2015-10-23 MED ORDER — GUAIFENESIN 100 MG/5ML PO SOLN
15.0000 mL | Freq: Once | ORAL | Status: AC
  Administered 2015-10-23: 300 mg via ORAL

## 2015-10-23 MED ORDER — DOCUSATE SODIUM 100 MG PO CAPS
100.0000 mg | ORAL_CAPSULE | Freq: Every day | ORAL | Status: DC
  Administered 2015-10-24: 100 mg via ORAL
  Filled 2015-10-23 (×2): qty 1

## 2015-10-23 MED ORDER — CALCIUM CARBONATE ANTACID 500 MG PO CHEW
2.0000 | CHEWABLE_TABLET | ORAL | Status: DC | PRN
  Filled 2015-10-23: qty 2

## 2015-10-23 MED ORDER — GUAIFENESIN 100 MG/5ML PO SOLN
5.0000 mL | ORAL | Status: DC | PRN
  Filled 2015-10-23: qty 15

## 2015-10-23 MED ORDER — HYDROMORPHONE HCL 1 MG/ML IJ SOLN
2.0000 mg | Freq: Once | INTRAMUSCULAR | Status: AC
  Administered 2015-10-23: 2 mg via INTRAVENOUS
  Filled 2015-10-23: qty 2

## 2015-10-23 MED ORDER — ZOLPIDEM TARTRATE 5 MG PO TABS: 5.0000 mg | ORAL_TABLET | Freq: Every evening | ORAL | Status: DC | PRN

## 2015-10-23 MED ORDER — ACETAMINOPHEN 325 MG PO TABS: 650.0000 mg | ORAL_TABLET | ORAL | Status: DC | PRN

## 2015-10-23 MED ORDER — FENTANYL CITRATE (PF) 100 MCG/2ML IJ SOLN
50.0000 ug | Freq: Once | INTRAMUSCULAR | Status: AC
  Administered 2015-10-23: 50 ug via INTRAVENOUS
  Filled 2015-10-23: qty 2

## 2015-10-23 NOTE — MAU Provider Note (Signed)
History     CSN: [id number]  Arrival date and time: 10/23/15 6213   First Provider Initiated Contact with Patient 10/23/15 1953      No chief complaint on file.  HPI   [name] is a 35 y.o. female [phone] @ [redacted]w[redacted]d presenting to MAU with lower abdominal pain/pressure in her lower pelvis. She is feeling pulling and burning in her lower abdomen. The pain starts in her lower abdomen and radiates to her lower back; both sides.   The pain in her back started weeks ago and she attributed that to her pregnancy.  The pain in her lower abdomen started 2-3 days ago and has progressively gotten worse throughout today.   The pain is constant currently.  + fetal movement Denies vaginal bleeding.   OB History    Gravida Para Term Preterm AB TAB SAB Ectopic Multiple Living   Past Medical History  Diagnosis Date  . Heart palpitations     Past Surgical History  Procedure Laterality Date  . Cesarean section    . Dilation and curettage of uterus      No family history on file.  Social History  Substance Use Topics  . Smoking status: Former Smoker -- 1.00 packs/day    Types: Cigarettes  . Smokeless tobacco: Former Neurosurgeon    Quit date: 04/17/2003  . Alcohol Use: No    Allergies:  Allergies  Allergen Reactions  . Penicillins Hives    Has patient had a PCN reaction causing immediate rash, facial/tongue/throat swelling, SOB or lightheadedness with hypotension: Yes Has patient had a PCN reaction causing severe rash involving mucus membranes or skin necrosis: No Has patient had a PCN reaction that required hospitalization No Has patient had a PCN reaction occurring within the last 10 years: No If all of the above answers are "NO", then may proceed with Cephalosporin use.     Prescriptions prior to admission  Medication Sig Dispense Refill Last Dose  . calcium carbonate (TUMS - DOSED IN MG ELEMENTAL CALCIUM) 500 MG chewable tablet Chew 4 tablets by  mouth 3 (three) times daily as needed for indigestion or heartburn.   10/23/2015 at Unknown time  . Doxylamine-Pyridoxine (DICLEGIS) 10-10 MG TBEC Take 2 tablets by mouth at bedtime as needed (for nausea/vomiting).   Past Week at Unknown time  . [DISCONTINUED] clotrimazole-betamethasone (LOTRISONE) cream Apply to affected area 2 times daily x 7 days (Patient not taking: Reported on 10/23/2015) 15 g 0 Not Taking at Unknown time  . [DISCONTINUED] HYDROcodone-acetaminophen (NORCO/VICODIN) 5-325 MG per tablet Take 1 tablet by mouth every 6 (six) hours as needed for severe pain. (Patient not taking: Reported on 10/23/2015) 20 tablet 0 Not Taking at Unknown time  . [DISCONTINUED] ibuprofen (ADVIL,MOTRIN) 800 MG tablet Take 1 tablet (800 mg total) by mouth 3 (three) times daily. (Patient not taking: Reported on 10/23/2015) 30 tablet 0 Not Taking at Unknown time  . [DISCONTINUED] predniSONE (DELTASONE) 50 MG tablet Take 1 pill daily for 5 days (Patient not taking: Reported on 10/23/2015) 5 tablet 0 Not Taking at Unknown time   Results for orders placed or performed during the hospital encounter of 10/23/15 (from the past 48 hour(s))  Urinalysis, Routine w reflex microscopic (not at [facility])     Status: Abnormal   Collection Time: 10/23/15  7:21 PM  Result Value Ref Range   Color, Urine YELLOW YELLOW   APPearance CLEAR  CLEAR   Specific Gravity, Urine 1.015 1.005 - 1.030   pH 6.5 5.0 - 8.0   Glucose, UA NEGATIVE NEGATIVE mg/dL   Hgb urine dipstick SMALL (A) NEGATIVE   Bilirubin Urine NEGATIVE NEGATIVE   Ketones, ur NEGATIVE NEGATIVE mg/dL   Protein, ur NEGATIVE NEGATIVE mg/dL   Nitrite NEGATIVE NEGATIVE   Leukocytes, UA NEGATIVE NEGATIVE  Urine microscopic-add on     Status: Abnormal   Collection Time: 10/23/15  7:21 PM  Result Value Ref Range   Squamous Epithelial / LPF 0-5 (A) NONE SEEN   WBC, UA NONE SEEN 0 - 5 WBC/hpf   RBC / HPF 0-5 0 - 5 RBC/hpf   Bacteria, UA FEW (A) NONE SEEN   Casts GRANULAR CAST  (A) NEGATIVE   Urine-Other MUCOUS PRESENT   CBC with Differential     Status: Abnormal   Collection Time: 10/23/15  8:29 PM  Result Value Ref Range   WBC 11.9 (H) 4.0 - 10.5 K/uL   RBC 4.03 3.87 - 5.11 MIL/uL   Hemoglobin 12.5 12.0 - 15.0 g/dL   HCT 82.9 56.2 - 13.0 %   MCV 89.8 78.0 - 100.0 fL   MCH 31.0 26.0 - 34.0 pg   MCHC 34.5 30.0 - 36.0 g/dL   RDW 86.5 78.4 - 69.6 %   Platelets 227 150 - 400 K/uL   Neutrophils Relative % 71 %   Lymphocytes Relative 21 %   Monocytes Relative 6 %   Eosinophils Relative 2 %   Basophils Relative 0 %   Other 0 %   Neutro Abs 8.5 (H) 1.7 - 7.7 K/uL   Lymphs Abs 2.5 0.7 - 4.0 K/uL   Monocytes Absolute 0.7 0.1 - 1.0 K/uL   Eosinophils Absolute 0.2 0.0 - 0.7 K/uL   Basophils Absolute 0.0 0.0 - 0.1 K/uL   US Renal  10/23/2015  CLINICAL DATA:  Subacute onset of dysuria, hematuria and generalized abdominal pain and back pain. Initial encounter. EXAM: RENAL / URINARY TRACT ULTRASOUND COMPLETE COMPARISON:  None. FINDINGS: Right Kidney: Length: 12.0 cm. Echogenicity within normal limits. No mass or hydronephrosis visualized. Left Kidney: Length: 12.6 cm. Echogenicity within normal limits. No mass or hydronephrosis visualized. Bladder: Appears normal for degree of bladder distention. Bilateral ureteral jets are visualized. IMPRESSION: Unremarkable renal ultrasound. Electronically Signed   By: Roanna Raider M.D.   On: 10/23/2015 21:14    Review of Systems  Constitutional: Negative for fever and chills.  Gastrointestinal: Positive for abdominal pain. Negative for heartburn, nausea and vomiting.  Genitourinary: Positive for dysuria, urgency and frequency.  Musculoskeletal: Positive for back pain.   Physical Exam   Blood pressure 116/55, pulse 87, temperature 98 F (36.7 C), temperature source Oral, resp. rate 20.  Physical Exam  Constitutional: She is oriented to person, place, and time. She appears well-developed and well-nourished. She has a sickly  appearance. She appears distressed.  Respiratory: Effort normal.  GI: Soft. Normal appearance. There is tenderness in the right lower quadrant, periumbilical area, suprapubic area and left lower quadrant. There is guarding. There is no rigidity, no rebound and no CVA tenderness. No hernia.  Genitourinary:  Dilation: Closed Exam by:: Shela Commons Sejal Cofield, NP  Musculoskeletal: Normal range of motion.  Neurological: She is alert and oriented to person, place, and time.  Skin: Skin is warm.  Psychiatric: Her behavior is normal.   Fetal Tracing: Baseline: 130 bpm  Variability: moderate  Accelerations: 15x15 Decelerations: None Toco: initially Q 3-4 mins, following IV fluids  contractions: rare.   MAU Course  Procedures  None  MDM  CBC with diff Fentanyl  50 mgc IV  Urine culture- pending  Renal US Discussed patient with Dr. Mindi SlickerBanga @ 2130 Dilaudid 2 mg IV Continue LR @ 125 ml hour Patient up to the bathroom with assistance. Patient says she has significant pain with urination.  Shortly after the dilaudid the patient complains of nausea and has One episode of vomiting. Patient says her pain level has improved. Dr. Mindi SlickerBanga notified; will admit for pain management.  Pyridium    Assessment and Plan   A:  1. Abdominal pain in pregnancy, antepartum   2. Dysuria     P:  Admit to [facility] for pain management.   Duane LopeJennifer I Iwao Shamblin, NP 10/23/2015 8:03 PM

## 2015-10-23 NOTE — H&P (Signed)
Tonya [name] is a 35 y.o. female presenting for pain with urination, increased frequency of urination and diffuse body pain. Pt states urinary symptoms begun about 4days ago and have worsened over time. Her ur symptoms begun about two weeks ago and have stayed persistent despite otc medications she has tried. She admits that when she coughs or sneezes her lower pelvic/bladder discomfort worsens. She describes the pain as constant. No flank tenderness. Was told may have a kidney stone when seen in office last week - neg urine culture. Denies any fever/chills  Maternal Medical History:  Reason for admission: Nausea.  Dysuria and diffuse pain in pregnancy  Contractions: Frequency: irregular.   Perceived severity is mild.    Fetal activity: Perceived fetal activity is normal.   Last perceived fetal movement was within the past hour.    Prenatal complications: Nephrolithiasis.     OB History    Gravida Para Term Preterm AB TAB SAB Ectopic Multiple Living   Past Medical History  Diagnosis Date  . Heart palpitations    Past Surgical History  Procedure Laterality Date  . Cesarean section    . Dilation and curettage of uterus     Family History: family history is not on file. Social History:  reports that she has quit smoking. Her smoking use included Cigarettes. She smoked 1.00 pack per day. She quit smokeless tobacco use about 12 years ago. She reports that she does not drink alcohol or use illicit drugs.   Prenatal Transfer Tool  Maternal Diabetes: No Genetic Screening: Normal Maternal Ultrasounds/Referrals: Normal Fetal Ultrasounds or other Referrals:  None Maternal Substance Abuse:  No Significant Maternal Medications:  None Significant Maternal Lab Results:  Lab values include: Other: neg ua ; pending urine culture; wbc 11 Other Comments:  None  Review of Systems  Constitutional: Positive for malaise/fatigue. Negative for fever, chills, weight loss and  diaphoresis.  HENT: Positive for congestion and sore throat.   Eyes: Negative for blurred vision.  Respiratory: Positive for cough and sputum production.   Cardiovascular: Negative for chest pain and palpitations.  Gastrointestinal: Positive for nausea, vomiting and abdominal pain. Negative for heartburn, diarrhea and constipation.  Genitourinary: Positive for dysuria, urgency, frequency, hematuria and flank pain.  Musculoskeletal: Positive for myalgias and back pain. Negative for falls.  Skin: Negative for itching and rash.  Neurological: Positive for weakness. Negative for dizziness and headaches.  Psychiatric/Behavioral: Negative for depression, suicidal ideas and substance abuse. The patient is not nervous/anxious.     Dilation: Closed Exam by:: J. Rasch, NP Blood pressure 116/55, pulse 87, temperature 97.7 F (36.5 C), temperature source Oral, resp. rate 20. Maternal Exam:  Uterine Assessment: Contraction strength is mild.  Contraction frequency is rare.   Abdomen: Patient reports generalized tenderness.  Patient reports the following abdominal tenderness: RLQ and RUQ.  Surgical scars: low transverse.   Estimated fetal weight is AGA.       Physical Exam  Constitutional: She is oriented to person, place, and time. She appears well-developed and well-nourished.  Neck: Normal range of motion.  Respiratory: Effort normal.  GI: Soft. There is generalized tenderness and tenderness in the right upper quadrant and right lower quadrant. There is no guarding.  Musculoskeletal: Normal range of motion.  Neurological: She is alert and oriented to person, place, and time.  Skin: Skin is warm.  Psychiatric: She has a normal mood and affect. Her behavior is  normal. Judgment and thought content normal.    Prenatal labs: ABO, Rh:   Antibody:   Rubella:   RPR:    HBsAg:    HIV:    GBS:     Assessment/Plan: [id number] female admitted for management of diffuse body pain, dysuria and  nausea/vomiting Will repeat cbc in am Flexeril and pyridium for relief of discomfort Continuous monitoring given hx prior c/s; desires repeat for delivery If stable in am will consider discharge to home If regular contractions ensue will check cervix    Sharol Givenecilia Worema Vola Beneke 10/23/2015, 11:57 PM

## 2015-10-23 NOTE — MAU Note (Signed)
Pt states saw ob/gyn on 10/16/15 and urinalysis showed kidney stones.  Pt stated now having pain similar to a UTI, burning with urination, urinary frequency, pelvic pain, and lower back pain 7/10.  Pt states has been staying hydrated. Denies bleeding.

## 2015-10-24 ENCOUNTER — Encounter (HOSPITAL_COMMUNITY): Payer: Self-pay | Admitting: *Deleted

## 2015-10-24 DIAGNOSIS — R110 Nausea: Secondary | ICD-10-CM | POA: Diagnosis present

## 2015-10-24 DIAGNOSIS — R1084 Generalized abdominal pain: Secondary | ICD-10-CM

## 2015-10-24 DIAGNOSIS — O26899 Other specified pregnancy related conditions, unspecified trimester: Secondary | ICD-10-CM

## 2015-10-24 DIAGNOSIS — R300 Dysuria: Secondary | ICD-10-CM | POA: Diagnosis present

## 2015-10-24 LAB — TYPE AND SCREEN
ABO/RH(D): A POS
Antibody Screen: NEGATIVE

## 2015-10-24 LAB — URINE CULTURE

## 2015-10-24 LAB — CBC
HEMATOCRIT: 33.5 % — AB (ref 36.0–46.0)
Hemoglobin: 11.2 g/dL — ABNORMAL LOW (ref 12.0–15.0)
MCH: 30.3 pg (ref 26.0–34.0)
MCHC: 33.4 g/dL (ref 30.0–36.0)
MCV: 90.5 fL (ref 78.0–100.0)
Platelets: 186 10*3/uL (ref 150–400)
RBC: 3.7 MIL/uL — ABNORMAL LOW (ref 3.87–5.11)
RDW: 13.8 % (ref 11.5–15.5)
WBC: 13 10*3/uL — AB (ref 4.0–10.5)

## 2015-10-24 LAB — ABO/RH: ABO/RH(D): A POS

## 2015-10-24 MED ORDER — TRAMADOL HCL 50 MG PO TABS: 50.0000 mg | ORAL_TABLET | Freq: Four times a day (QID) | ORAL | Status: DC

## 2015-10-24 MED ORDER — CYCLOBENZAPRINE HCL 10 MG PO TABS: 5.0000 mg | ORAL_TABLET | Freq: Three times a day (TID) | ORAL | Status: DC

## 2015-10-24 MED ORDER — CYCLOBENZAPRINE HCL 5 MG PO TABS: 5.0000 mg | ORAL_TABLET | Freq: Three times a day (TID) | ORAL | Status: DC

## 2015-10-24 MED ORDER — PHENAZOPYRIDINE HCL 100 MG PO TABS: 100.0000 mg | ORAL_TABLET | Freq: Three times a day (TID) | ORAL | Status: DC | PRN

## 2015-10-24 MED ORDER — NITROFURANTOIN MONOHYD MACRO 100 MG PO CAPS
100.0000 mg | ORAL_CAPSULE | Freq: Two times a day (BID) | ORAL | Status: DC
  Administered 2015-10-24: 100 mg via ORAL
  Filled 2015-10-24 (×3): qty 1

## 2015-10-24 MED ORDER — NITROFURANTOIN MONOHYD MACRO 100 MG PO CAPS: 100.0000 mg | ORAL_CAPSULE | Freq: Two times a day (BID) | ORAL | Status: DC

## 2015-10-24 MED ORDER — CYCLOBENZAPRINE HCL 10 MG PO TABS
5.0000 mg | ORAL_TABLET | Freq: Three times a day (TID) | ORAL | Status: DC
  Administered 2015-10-24 (×2): 5 mg via ORAL
  Filled 2015-10-24 (×2): qty 1

## 2015-10-24 NOTE — Discharge Instructions (Signed)
Call if symptoms worsen: fever chills, severe abdominal pain, contractions or decreased fetal movements Dysuria Dysuria is pain or discomfort while urinating. The pain or discomfort may be felt in the tube that carries urine out of the bladder (urethra) or in the surrounding tissue of the genitals. The pain may also be felt in the groin area, lower abdomen, and lower back. You may have to urinate frequently or have the sudden feeling that you have to urinate (urgency). Dysuria can affect both men and women, but is more common in women. Dysuria can be caused by many different things, including:  Urinary tract infection in women.  Infection of the kidney or bladder.  Kidney stones or bladder stones.  Certain sexually transmitted infections (STIs), such as chlamydia.  Dehydration.  Inflammation of the vagina.  Use of certain medicines.  Use of certain soaps or scented products that cause irritation. HOME CARE INSTRUCTIONS Watch your dysuria for any changes. The following actions may help to reduce any discomfort you are feeling: 1. Drink enough fluid to keep your urine clear or pale yellow. 2. Empty your bladder often. Avoid holding urine for long periods of time. 3. After a bowel movement or urination, women should cleanse from front to back, using each tissue only once. 4. Empty your bladder after sexual intercourse. 5. Take medicines only as directed by your health care provider. 6. If you were prescribed an antibiotic medicine, finish it all even if you start to feel better. 7. Avoid caffeine, tea, and alcohol. They can irritate the bladder and make dysuria worse. In men, alcohol may irritate the prostate. 8. Keep all follow-up visits as directed by your health care provider. This is important. 9. If you had any tests done to find the cause of dysuria, it is your responsibility to obtain your test results. Ask the lab or department performing the test when and how you will get your  results. Talk with your health care provider if you have any questions about your results. SEEK MEDICAL CARE IF:  You develop pain in your back or sides.  You have a fever.  You have nausea or vomiting.  You have blood in your urine.  You are not urinating as often as you usually do. SEEK IMMEDIATE MEDICAL CARE IF:  You pain is severe and not relieved with medicines.  You are unable to hold down any fluids.  You or someone else notices a change in your mental function.  You have a rapid heartbeat at rest.  You have shaking or chills.  You feel extremely weak.   This information is not intended to replace advice given to you by your health care provider. Make sure you discuss any questions you have with your health care provider.   Document Released: 02/15/2004 Document Revised: 06/09/2014 Document Reviewed: 01/12/2014 Elsevier Interactive Patient Education 2016 Else[employer] Fetal Movement Counts Patient Name: __________________________________________________ Patient Due Date: ____________________ Performing a fetal movement count is highly recommended in high-risk pregnancies, but it is good for every pregnant woman to do. Your health care provider may ask you to start counting fetal movements at 28 weeks of the pregnancy. Fetal movements often increase:  After eating a full meal.  After physical activity.  After eating or drinking something sweet or cold.  At rest. Pay attention to when you feel the baby is most active. This will help you notice a pattern of your baby's sleep and wake cycles and what factors contribute to an increase in fetal movement.  It is important to perform a fetal movement count at the same time each day when your baby is normally most active.  HOW TO COUNT FETAL MOVEMENTS 10. Find a quiet and comfortable area to sit or lie down on your left side. Lying on your left side provides the best blood and oxygen circulation to your baby. 11. Write down  the day and time on a sheet of paper or in a journal. 12. Start counting kicks, flutters, swishes, rolls, or jabs in a 2-hour period. You should feel at least 10 movements within 2 hours. 13. If you do not feel 10 movements in 2 hours, wait 2-3 hours and count again. Look for a change in the pattern or not enough counts in 2 hours. SEEK MEDICAL CARE IF:  You feel less than 10 counts in 2 hours, tried twice.  There is no movement in over an hour.  The pattern is changing or taking longer each day to reach 10 counts in 2 hours.  You feel the baby is not moving as he or she usually does. Date: ____________ Movements: ____________ Start time: ____________ Doreatha Martin time: ____________  Date: ____________ Movements: ____________ Start time: ____________ Doreatha Martin time: ____________ Date: ____________ Movements: ____________ Start time: ____________ Doreatha Martin time: ____________ Date: ____________ Movements: ____________ Start time: ____________ Doreatha Martin time: ____________ Date: ____________ Movements: ____________ Start time: ____________ Doreatha Martin time: ____________ Date: ____________ Movements: ____________ Start time: ____________ Doreatha Martin time: ____________ Date: ____________ Movements: ____________ Start time: ____________ Doreatha Martin time: ____________ Date: ____________ Movements: ____________ Start time: ____________ Doreatha Martin time: ____________  Date: ____________ Movements: ____________ Start time: ____________ Doreatha Martin time: ____________ Date: ____________ Movements: ____________ Start time: ____________ Doreatha Martin time: ____________ Date: ____________ Movements: ____________ Start time: ____________ Doreatha Martin time: ____________ Date: ____________ Movements: ____________ Start time: ____________ Doreatha Martin time: ____________ Date: ____________ Movements: ____________ Start time: ____________ Doreatha Martin time: ____________ Date: ____________ Movements: ____________ Start time: ____________ Doreatha Martin time: ____________ Date:  ____________ Movements: ____________ Start time: ____________ Doreatha Martin time: ____________  Date: ____________ Movements: ____________ Start time: ____________ Doreatha Martin time: ____________ Date: ____________ Movements: ____________ Start time: ____________ Doreatha Martin time: ____________ Date: ____________ Movements: ____________ Start time: ____________ Doreatha Martin time: ____________ Date: ____________ Movements: ____________ Start time: ____________ Doreatha Martin time: ____________ Date: ____________ Movements: ____________ Start time: ____________ Doreatha Martin time: ____________ Date: ____________ Movements: ____________ Start time: ____________ Doreatha Martin time: ____________ Date: ____________ Movements: ____________ Start time: ____________ Doreatha Martin time: ____________  Date: ____________ Movements: ____________ Start time: ____________ Doreatha Martin time: ____________ Date: ____________ Movements: ____________ Start time: ____________ Doreatha Martin time: ____________ Date: ____________ Movements: ____________ Start time: ____________ Doreatha Martin time: ____________ Date: ____________ Movements: ____________ Start time: ____________ Doreatha Martin time: ____________ Date: ____________ Movements: ____________ Start time: ____________ Doreatha Martin time: ____________ Date: ____________ Movements: ____________ Start time: ____________ Doreatha Martin time: ____________ Date: ____________ Movements: ____________ Start time: ____________ Doreatha Martin time: ____________  Date: ____________ Movements: ____________ Start time: ____________ Doreatha Martin time: ____________ Date: ____________ Movements: ____________ Start time: ____________ Doreatha Martin time: ____________ Date: ____________ Movements: ____________ Start time: ____________ Doreatha Martin time: ____________ Date: ____________ Movements: ____________ Start time: ____________ Doreatha Martin time: ____________ Date: ____________ Movements: ____________ Start time: ____________ Doreatha Martin time: ____________ Date: ____________ Movements: ____________ Start  time: ____________ Doreatha Martin time: ____________ Date: ____________ Movements: ____________ Start time: ____________ Doreatha Martin time: ____________  Date: ____________ Movements: ____________ Start time: ____________ Doreatha Martin time: ____________ Date: ____________ Movements: ____________ Start time: ____________ Doreatha Martin time: ____________ Date: ____________ Movements: ____________ Start time: ____________ Doreatha Martin time: ____________ Date: ____________ Movements: ____________ Start time: ____________ Doreatha Martin time: ____________ Date: ____________ Movements: ____________ Start time:  ____________ Doreatha MartinFinish time: ____________ Date: ____________ Movements: ____________ Start time: ____________ Doreatha MartinFinish time: ____________ Date: ____________ Movements: ____________ Start time: ____________ Doreatha MartinFinish time: ____________  Date: ____________ Movements: ____________ Start time: ____________ Doreatha MartinFinish time: ____________ Date: ____________ Movements: ____________ Start time: ____________ Doreatha MartinFinish time: ____________ Date: ____________ Movements: ____________ Start time: ____________ Doreatha MartinFinish time: ____________ Date: ____________ Movements: ____________ Start time: ____________ Doreatha MartinFinish time: ____________ Date: ____________ Movements: ____________ Start time: ____________ Doreatha MartinFinish time: ____________ Date: ____________ Movements: ____________ Start time: ____________ Doreatha MartinFinish time: ____________ Date: ____________ Movements: ____________ Start time: ____________ Doreatha MartinFinish time: ____________  Date: ____________ Movements: ____________ Start time: ____________ Doreatha MartinFinish time: ____________ Date: ____________ Movements: ____________ Start time: ____________ Doreatha MartinFinish time: ____________ Date: ____________ Movements: ____________ Start time: ____________ Doreatha MartinFinish time: ____________ Date: ____________ Movements: ____________ Start time: ____________ Doreatha MartinFinish time: ____________ Date: ____________ Movements: ____________ Start time: ____________ Doreatha MartinFinish time:  ____________ Date: ____________ Movements: ____________ Start time: ____________ Doreatha MartinFinish time: ____________   This information is not intended to replace advice given to you by your health care provider. Make sure you discuss any questions you have with your health care provider.   Document Released: 06/18/2006 Document Revised: 06/09/2014 Document Reviewed: 03/15/2012 Elsevier Interactive Patient Education [employer]2016 Elsevier Inc[employer]

## 2015-10-24 NOTE — Progress Notes (Signed)
Patient ID: Tonya [name], female   DOB: July 12, 1980, 35 y.o.   MRN: [id number] Pt denies any fever or chills. Pyridium and flexeril helped pain overnight - was able to sleep but feels like symptoms may start again. Mild lower pelvic cramping. Denies any contractions or VB. +Fms VSS- afeb ABD - c/w ga; soft EXT - no homans  Wbc 13 ( from 11) - stable  A/P: [id number] at 32 3/[redacted]wks gestation with dysuria and abd pain          HD#1- pain better controlled\                    afeb                     Discharge to home with macrobid, pyridium, flexeril and tramadol prn                    Will call pt with results of urine culture tomorrow when avail                     F/u in office on 5/26

## 2015-10-24 NOTE — Discharge Summary (Signed)
    OB Discharge Summary     Patient Name: Tonya [name] DOB: [birth year] MRN: [id number]  Date of admission: 10/23/2015 Delivering MD: This patient has no babies on file.  Date of discharge: 10/24/2015  Admitting diagnosis: 32wks kidney stones, uti pain,  Intrauterine pregnancy: 6391w3d     Secondary diagnosis:  Active Problems:   Dysuria   Pregnancy with generalized abdominal pain, antepartum   Nausea with vomiting  Additional problems: none     Discharge diagnosis: dysuria - improved; generalized pain - improved                                                                                                  Hospital course: Pt admitted for observation overnight and management of dysuria, generalized pain and nausea. Symptoms improved overnight with medication. Pt stable for discharge to home with ongoing outpt managment  Physical exam  Filed Vitals:   10/24/15 0120 10/24/15 0830 10/24/15 0831 10/24/15 0956  BP: 94/41 83/39 82/47  97/52  Pulse: 77 82 82 76  Temp: 97.7 F (36.5 C) 98.7 F (37.1 C)    TempSrc: Oral Oral    Resp: 18 15  16   Height: 5\' 3"  (1.6 m)     Weight: 173 lb (78.472 kg)      General: alert, cooperative and no distress Lochia: n/a Uterine Fundus: c/w ga Incision: N/A DVT Evaluation: No evidence of DVT seen on physical exam. Labs: Lab Results  Component Value Date   WBC 13.0* 10/24/2015   HGB 11.2* 10/24/2015   HCT 33.5* 10/24/2015   MCV 90.5 10/24/2015   PLT 186 10/24/2015   No flowsheet data found.  Discharge instruction: per After Visit Summary and "Baby and Me Booklet".  After visit meds:    Medication List    ASK your doctor about these medications        calcium carbonate 500 MG chewable tablet  Commonly known as:  TUMS - dosed in mg elemental calcium  Chew 4 tablets by mouth 3 (three) times daily as needed for indigestion or heartburn.     DICLEGIS 10-10 MG Tbec  Generic drug:  Doxylamine-Pyridoxine  Take 2 tablets by mouth at  bedtime as needed (for nausea/vomiting).        Diet: routine diet  Activity: Advance as tolerated. Pelvic rest for 6 weeks.   Outpatient follow up:2days Follow up Appt:No future appointments. Follow up Visit:No Follow-up on file.  10/24/2015 Tonya Delatte [name], DO

## 2015-11-01 ENCOUNTER — Inpatient Hospital Stay (HOSPITAL_COMMUNITY)
Admission: AD | Admit: 2015-11-01 | Discharge: 2015-11-01 | DRG: 781 | Disposition: A | Discharge status: Home / Self Care | Payer: Medicaid Other | Source: Ambulatory Visit | Attending: Obstetrics and Gynecology | Admitting: Obstetrics and Gynecology

## 2015-11-01 ENCOUNTER — Encounter (HOSPITAL_COMMUNITY): Payer: Self-pay

## 2015-11-01 ENCOUNTER — Inpatient Hospital Stay (HOSPITAL_COMMUNITY): Payer: Medicaid Other

## 2015-11-01 DIAGNOSIS — Z87891 Personal history of nicotine dependence: Secondary | ICD-10-CM

## 2015-11-01 DIAGNOSIS — N200 Calculus of kidney: Secondary | ICD-10-CM | POA: Diagnosis present

## 2015-11-01 DIAGNOSIS — O26893 Other specified pregnancy related conditions, third trimester: Principal | ICD-10-CM | POA: Diagnosis present

## 2015-11-01 DIAGNOSIS — Z3A33 33 weeks gestation of pregnancy: Secondary | ICD-10-CM

## 2015-11-01 LAB — CBC
HEMATOCRIT: 36.9 % (ref 36.0–46.0)
Hemoglobin: 12.8 g/dL (ref 12.0–15.0)
MCH: 30.8 pg (ref 26.0–34.0)
MCHC: 34.7 g/dL (ref 30.0–36.0)
MCV: 88.9 fL (ref 78.0–100.0)
Platelets: 251 10*3/uL (ref 150–400)
RBC: 4.15 MIL/uL (ref 3.87–5.11)
RDW: 13.8 % (ref 11.5–15.5)
WBC: 12 10*3/uL — ABNORMAL HIGH (ref 4.0–10.5)

## 2015-11-01 LAB — URINALYSIS, ROUTINE W REFLEX MICROSCOPIC
BILIRUBIN URINE: NEGATIVE
Glucose, UA: NEGATIVE mg/dL
Ketones, ur: NEGATIVE mg/dL
Leukocytes, UA: NEGATIVE
NITRITE: NEGATIVE
PROTEIN: NEGATIVE mg/dL
Specific Gravity, Urine: 1.01 (ref 1.005–1.030)
pH: 6 (ref 5.0–8.0)

## 2015-11-01 LAB — COMPREHENSIVE METABOLIC PANEL
ALT: 16 U/L (ref 14–54)
ANION GAP: 11 (ref 5–15)
AST: 16 U/L (ref 15–41)
Albumin: 3.4 g/dL — ABNORMAL LOW (ref 3.5–5.0)
Alkaline Phosphatase: 120 U/L (ref 38–126)
BILIRUBIN TOTAL: 0.6 mg/dL (ref 0.3–1.2)
BUN: 11 mg/dL (ref 6–20)
CO2: 20 mmol/L — ABNORMAL LOW (ref 22–32)
Calcium: 9.4 mg/dL (ref 8.9–10.3)
Chloride: 106 mmol/L (ref 101–111)
Creatinine, Ser: 0.62 mg/dL (ref 0.44–1.00)
Glucose, Bld: 94 mg/dL (ref 65–99)
POTASSIUM: 4.1 mmol/L (ref 3.5–5.1)
Sodium: 137 mmol/L (ref 135–145)
TOTAL PROTEIN: 6.6 g/dL (ref 6.5–8.1)

## 2015-11-01 LAB — TYPE AND SCREEN
ABO/RH(D): A POS
ANTIBODY SCREEN: NEGATIVE

## 2015-11-01 LAB — URINE MICROSCOPIC-ADD ON

## 2015-11-01 MED ORDER — PROMETHAZINE HCL 25 MG PO TABS
25.0000 mg | ORAL_TABLET | Freq: Four times a day (QID) | ORAL | Status: DC | PRN
  Administered 2015-11-01: 25 mg via ORAL
  Filled 2015-11-01: qty 1

## 2015-11-01 MED ORDER — ONDANSETRON 4 MG PO TBDP
4.0000 mg | ORAL_TABLET | Freq: Three times a day (TID) | ORAL | Status: DC
  Administered 2015-11-01: 4 mg via ORAL
  Filled 2015-11-01 (×4): qty 1

## 2015-11-01 MED ORDER — CALCIUM CARBONATE ANTACID 500 MG PO CHEW
2.0000 | CHEWABLE_TABLET | ORAL | Status: DC | PRN
  Filled 2015-11-01: qty 2

## 2015-11-01 MED ORDER — LACTATED RINGERS IV BOLUS (SEPSIS)
1000.0000 mL | Freq: Once | INTRAVENOUS | Status: AC
  Administered 2015-11-01: 1000 mL via INTRAVENOUS

## 2015-11-01 MED ORDER — ACETAMINOPHEN 325 MG PO TABS: 650.0000 mg | ORAL_TABLET | ORAL | Status: DC | PRN

## 2015-11-01 MED ORDER — PRENATAL MULTIVITAMIN CH
1.0000 | ORAL_TABLET | Freq: Every day | ORAL | Status: DC
  Administered 2015-11-01: 1 via ORAL
  Filled 2015-11-01 (×2): qty 1

## 2015-11-01 MED ORDER — HYDROMORPHONE HCL 1 MG/ML IJ SOLN
0.5000 mg | Freq: Once | INTRAMUSCULAR | Status: AC
  Administered 2015-11-01: 0.5 mg via INTRAVENOUS
  Filled 2015-11-01: qty 1

## 2015-11-01 MED ORDER — ZOLPIDEM TARTRATE 5 MG PO TABS: 5.0000 mg | ORAL_TABLET | Freq: Every evening | ORAL | Status: DC | PRN

## 2015-11-01 MED ORDER — TAMSULOSIN HCL 0.4 MG PO CAPS
0.4000 mg | ORAL_CAPSULE | Freq: Once | ORAL | Status: AC
  Administered 2015-11-01: 0.4 mg via ORAL
  Filled 2015-11-01: qty 1

## 2015-11-01 MED ORDER — DOCUSATE SODIUM 100 MG PO CAPS
100.0000 mg | ORAL_CAPSULE | Freq: Every day | ORAL | Status: DC
  Filled 2015-11-01: qty 1

## 2015-11-01 MED ORDER — LACTATED RINGERS IV SOLN
INTRAVENOUS | Status: DC
  Administered 2015-11-01: 02:00:00 via INTRAVENOUS

## 2015-11-01 MED ORDER — HYDROMORPHONE HCL 2 MG PO TABS: 2.0000 mg | ORAL_TABLET | ORAL | Status: DC | PRN

## 2015-11-01 MED ORDER — HYDROMORPHONE HCL 1 MG/ML IJ SOLN
1.0000 mg | INTRAMUSCULAR | Status: DC | PRN
  Administered 2015-11-01 (×2): 1 mg via INTRAVENOUS
  Filled 2015-11-01 (×2): qty 1

## 2015-11-01 NOTE — MAU Note (Signed)
Pt here with c/o back pain on the right side, coming around the side.  Was seen in the office today and was given a prescription for Flomax to try to help pass kidney stone but was unable to get it started.

## 2015-11-01 NOTE — Discharge Summary (Signed)
OB Discharge Summary     Patient Name: Tonya NeptuneShannon M Lowden DOB: Oct 06, 1980 MRN: [id number]  Date of admission: 11/01/2015 Delivering MD: This patient has no babies on file.  Date of discharge: 11/01/2015  Admitting diagnosis: 6433 WEEKS RIGHT SIDE PAIN Intrauterine pregnancy: 3321w4d     Secondary diagnosis:  Active Problems:   Nephrolithiasis  Additional problems: none     Discharge diagnosis: iup at 33 4/7wks, nephrolithiasis                                                                                                 Complications: None  Hospital course: Admitted for pain management due to presumed nephrolithiasis. Pain improved with dilaudid. Pt to be discharged to home tonight  Physical exam  Filed Vitals:   11/01/15 0155 11/01/15 0304 11/01/15 0330 11/01/15 0504  BP: 106/58 107/59  99/62  Pulse: 73 79  83  Temp:  98 F (36.7 C)  97.7 F (36.5 C)  TempSrc:  Oral  Oral  Resp:  18  16  Height:   5\' 3"  (1.6 m)   Weight:   178 lb (80.741 kg)   SpO2:  98%  100%   General: alert, cooperative and no distress Uterine Fundus: c/w ga DVT Evaluation: No evidence of DVT seen on physical exam. Labs: Lab Results  Component Value Date   WBC 12.0* 11/01/2015   HGB 12.8 11/01/2015   HCT 36.9 11/01/2015   MCV 88.9 11/01/2015   PLT 251 11/01/2015   CMP Latest Ref Rng 11/01/2015  Glucose 65 - 99 mg/dL 94  BUN 6 - 20 mg/dL 11  Creatinine 4.090.44 - 8.111.00 mg/dL 9.140.62  Sodium 782135 - 956145 mmol/L 137  Potassium 3.5 - 5.1 mmol/L 4.1  Chloride 101 - 111 mmol/L 106  CO2 22 - 32 mmol/L 20(L)  Calcium 8.9 - 10.3 mg/dL 9.4  Total Protein 6.5 - 8.1 g/dL 6.6  Total Bilirubin 0.3 - 1.2 mg/dL 0.6  Alkaline Phos 38 - 126 U/L 120  AST 15 - 41 U/L 16  ALT 14 - 54 U/L 16    Discharge instruction: per After Visit Summary and "Baby and Me Booklet".  After visit meds:    Medication List    TAKE these medications        calcium carbonate 500 MG chewable tablet  Commonly known as:  TUMS - dosed  in mg elemental calcium  Chew 4 tablets by mouth 3 (three) times daily as needed for indigestion or heartburn.     cyclobenzaprine 5 MG tablet  Commonly known as:  FLEXERIL  Take 1 tablet (5 mg total) by mouth 3 (three) times daily.     DICLEGIS 10-10 MG Tbec  Generic drug:  Doxylamine-Pyridoxine  Take 2 tablets by mouth at bedtime as needed (for nausea/vomiting).     HYDROmorphone 2 MG tablet  Commonly known as:  DILAUDID  Take 1 tablet (2 mg total) by mouth every 4 (four) hours as needed for severe pain.     nitrofurantoin (macrocrystal-monohydrate) 100 MG capsule  Commonly known as:  MACROBID  Take 1 capsule (  100 mg total) by mouth every 12 (twelve) hours.     phenazopyridine 100 MG tablet  Commonly known as:  PYRIDIUM  Take 1 tablet (100 mg total) by mouth 3 (three) times daily as needed for pain.     traMADol 50 MG tablet  Commonly known as:  ULTRAM  Take 1 tablet (50 mg total) by mouth every 6 (six) hours.        Diet: routine diet  Activity: Advance as tolerated. Pelvic rest for 6 weeks.   Outpatient follow up:1 week Follow up Appt:No future appointments. Follow up Visit:No Follow-up on file.    11/01/2015 Roth Ress Delene Loll, DO

## 2015-11-01 NOTE — Progress Notes (Signed)
Patient ID: Tonya [name], female   DOB: [birth year], 35 y.o.   MRN: [id number] Pt reports nausea and vomiting. Has not eaten much today. C/o pain beginning to resume. No fever or chills.  VSS ABD - c/w ga EXT - no Homans  A/P: IUP at 33 4/7wks; presumed nephrolithiasis        Will give zofran odt for nausea        Pt will try to eat after zofran and if able to then will take dilaudid po after; keep hydrated        If feels better after will keep discharge for tonight but if not will stay overnight and reassess in am

## 2015-11-01 NOTE — Discharge Instructions (Signed)
Monitor fetal movements and call if decreased; also call for contractions, bleeding or leakage of fluid

## 2015-11-01 NOTE — Progress Notes (Addendum)
Patient ID: Tonya [name], female   DOB: [birth year], 35 y.o.   MRN: [id number] Pt reports pain better controlled with dilaudid - rates at 5/10 after recent dose. She denies any fever, chills or n/v. Pain resumed when had a 5hr break with no meds 9 from 3-8am). She thinks can manage outpt with po med . No contractions, LOF or VB.+Fms VSS EFM- 120s, mild variability ( recent dilaudid) cat 1 TOCO - no contractions SVE - deferred  Wbc 12 ( was 11 on previous admission)  A/P: IUP at 7133 4/[redacted]wks gestation with presumed nephrolithiasis         Pain improving on dilaudid- switch to po         Afeb         Pending urine culture         Will likely discharge to home this pm with po dilaudid and pt to pick up flomax as rx from office         Continue to strain urine          Will treat prn urine culture         F/U in office next week or prn        Dopplers tonight prior to discharge

## 2015-11-01 NOTE — H&P (Addendum)
Tonya [name] is a 35 y.o. female [id number] at 33+4 with likely nephrolithiasis.  Admitted with 10/10 flank pain.  Admitted for pain control - IV dilaudid q 1 hr.  Was admitted previously for pain control.  Awoke early this AM with worsening pain.  Describes flank pain and dysuria.  Urine with RBC's.  +FM, no LOF, no VB, occ ctx.  Desires BTL, has signed papers.  Pain controlled with IV dilaudid.  Ur Cx P.  Maternal Medical History:  Fetal activity: Perceived fetal activity is normal.    Prenatal complications: Nephrolithiasis.   Prenatal Complications - Diabetes: none.    OB History    Gravida Para Term Preterm AB TAB SAB Ectopic Multiple Living   4 2 2  1  1   2     G1 LTCS 8#4 female G2 SAB G3 LTCS 7#2 female G4 present No abn pap No STDs  Past Medical History  Diagnosis Date  . Heart palpitations   eczema  Past Surgical History  Procedure Laterality Date  . Cesarean section x 2    . Dilation and curettage of uterus     Family History: family history is not on file. Social History:  reports that she has quit smoking. Her smoking use included Cigarettes. She smoked 1.00 pack per day. She quit smokeless tobacco use about 12 years ago. She reports that she does not drink alcohol or use illicit drugs.  Meds PNV, Diclegis All PCN, Walnuts   Prenatal Transfer Tool  Maternal Diabetes: No Genetic Screening: Normal Maternal Ultrasounds/Referrals: Normal Fetal Ultrasounds or other Referrals:  None Maternal Substance Abuse:  Yes:  Type: Other: quit smoking with pregnancy Significant Maternal Medications:  None Significant Maternal Lab Results:  None Other Comments:  None  Review of Systems  Constitutional: Negative.   HENT: Negative.   Eyes: Negative.   Respiratory: Negative.   Cardiovascular: Negative.   Gastrointestinal: Negative.   Genitourinary: Positive for dysuria, hematuria and flank pain.  Musculoskeletal: Positive for back pain.  Skin: Negative.   Neurological:  Negative.   Psychiatric/Behavioral: Negative.       Blood pressure 99/62, pulse 83, temperature 97.7 F (36.5 C), temperature source Oral, resp. rate 16, height 5\' 3"  (1.6 m), weight 80.741 kg (178 lb), SpO2 100 %. Maternal Exam:  Abdomen: Patient reports no abdominal tenderness. Fundal height is appropriate for gestation.       Physical Exam  Constitutional: She is oriented to person, place, and time. She appears well-developed and well-nourished.  HENT:  Head: Normocephalic and atraumatic.  Cardiovascular: Normal rate and regular rhythm.   Respiratory: Effort normal and breath sounds normal. No respiratory distress. She has no wheezes.  GI: Soft. Bowel sounds are normal. She exhibits no distension. There is no tenderness.  Flank pain  Musculoskeletal: Normal range of motion.  Neurological: She is alert and oriented to person, place, and time.  Skin: Skin is warm and dry.  Psychiatric: She has a normal mood and affect. Her behavior is normal.    Prenatal labs: ABO, Rh: --/--/A POS (06/01 0135) Antibody: NEG (06/01 0135) Rubella: Immune (12/14 0000) RPR:   NR HBsAg: Negative (12/14 0000)  HIV: Non-reactive (12/14 0000)  GBS:   unknown  Flu 05/16/15 Desires BTL - has signed papers  Hgb 14.8/Plt 230K/Ur Cx +/GC neg/Chl neg/Pap WNL/First trimester screen WNL/glucola 126 Nl renal US - x hydronwphrosis  Assessment/Plan: 34yo [id number] at 33+ with likely nephrolithiasis admitted for pain control Ur Cx P Dilaudid for pain NST  q shift   Tonya [name] 11/01/2015, 5:22 AM

## 2015-11-01 NOTE — Progress Notes (Signed)
Patient not in room, did not call for staff to ambulate her out for discharge.  Discharge instructions reviewed with patient by T. Corbitt, Charity fundraiserN.

## 2015-11-01 NOTE — MAU Provider Note (Signed)
History     CSN: [id number]  Arrival date and time: 11/01/15 0114   First Provider Initiated Contact with Patient 11/01/15 0135      Chief Complaint  Patient presents with  . Flank Pain   HPI Comments: Tonya [name] is a 35 y.o. [phone] at [id number] who presents today with right sided flank pain. She was seen here last week, and seen in the office on 5/31. She has presumed kidney stones. She was given a RX for flomax today, but was unable to get the prescription. She states that she awoke with sudden onset pain this morning.   Flank Pain This is a new problem. The current episode started today. The problem has been gradually worsening since onset. Pain location: Right flank, and lumbar spine  The quality of the pain is described as shooting and stabbing. The pain does not radiate. The pain is at a severity of 10/10. Associated symptoms include dysuria. Pertinent negatives include no abdominal pain or fever. Risk factors include pregnancy. She has tried nothing for the symptoms.    Past Medical History  Diagnosis Date  . Heart palpitations     Past Surgical History  Procedure Laterality Date  . Cesarean section    . Dilation and curettage of uterus      No family history on file.  Social History  Substance Use Topics  . Smoking status: Former Smoker -- 1.00 packs/day    Types: Cigarettes  . Smokeless tobacco: Former NeurosurgeonUser    Quit date: 04/17/2003  . Alcohol Use: No    Allergies:  Allergies  Allergen Reactions  . Penicillins Hives    Has patient had a PCN reaction causing immediate rash, facial/tongue/throat swelling, SOB or lightheadedness with hypotension: Yes Has patient had a PCN reaction causing severe rash involving mucus membranes or skin necrosis: No Has patient had a PCN reaction that required hospitalization No Has patient had a PCN reaction occurring within the last 10 years: No If all of the above answers are "NO", then may proceed with Cephalosporin use.      Prescriptions prior to admission  Medication Sig Dispense Refill Last Dose  . calcium carbonate (TUMS - DOSED IN MG ELEMENTAL CALCIUM) 500 MG chewable tablet Chew 4 tablets by mouth 3 (three) times daily as needed for indigestion or heartburn.   10/31/2015 at Unknown time  . cyclobenzaprine (FLEXERIL) 5 MG tablet Take 1 tablet (5 mg total) by mouth 3 (three) times daily. 10 tablet 0 Past Week at Unknown time  . nitrofurantoin, macrocrystal-monohydrate, (MACROBID) 100 MG capsule Take 1 capsule (100 mg total) by mouth every 12 (twelve) hours. 13 capsule 0 Past Week at Unknown time  . phenazopyridine (PYRIDIUM) 100 MG tablet Take 1 tablet (100 mg total) by mouth 3 (three) times daily as needed for pain. 10 tablet 0 Past Week at Unknown time  . traMADol (ULTRAM) 50 MG tablet Take 1 tablet (50 mg total) by mouth every 6 (six) hours. 10 tablet 0 Past Week at Unknown time  . Doxylamine-Pyridoxine (DICLEGIS) 10-10 MG TBEC Take 2 tablets by mouth at bedtime as needed (for nausea/vomiting).   Past Week at Unknown time    Review of Systems  Constitutional: Negative for fever and chills.  Gastrointestinal: Negative for nausea, vomiting, abdominal pain, diarrhea and constipation.  Genitourinary: Positive for dysuria, hematuria and flank pain. Negative for urgency.   Physical Exam   Blood pressure 115/98, pulse 82, temperature 98.2 F (36.8 C), temperature source Oral, resp. rate  20.  Physical Exam  Constitutional: She is oriented to person, place, and time. She appears well-developed and well-nourished. She appears distressed.  HENT:  Head: Normocephalic.  Cardiovascular: Normal rate.   Respiratory: Effort normal.  GI: Soft. There is no tenderness.  Genitourinary:  + right sided CVA tenderness - CVA tenderness on the left.   Neurological: She is alert and oriented to person, place, and time.  Skin: Skin is warm and dry.   Results for orders placed or performed during the hospital encounter  of 11/01/15 (from the past 24 hour(s))  Urinalysis, Routine w reflex microscopic (not at [facility])     Status: Abnormal   Collection Time: 11/01/15  1:20 AM  Result Value Ref Range   Color, Urine YELLOW YELLOW   APPearance CLEAR CLEAR   Specific Gravity, Urine 1.010 1.005 - 1.030   pH 6.0 5.0 - 8.0   Glucose, UA NEGATIVE NEGATIVE mg/dL   Hgb urine dipstick LARGE (A) NEGATIVE   Bilirubin Urine NEGATIVE NEGATIVE   Ketones, ur NEGATIVE NEGATIVE mg/dL   Protein, ur NEGATIVE NEGATIVE mg/dL   Nitrite NEGATIVE NEGATIVE   Leukocytes, UA NEGATIVE NEGATIVE  Urine microscopic-add on     Status: Abnormal   Collection Time: 11/01/15  1:20 AM  Result Value Ref Range   Squamous Epithelial / LPF 0-5 (A) NONE SEEN   WBC, UA 0-5 0 - 5 WBC/hpf   RBC / HPF 6-30 0 - 5 RBC/hpf   Bacteria, UA FEW (A) NONE SEEN   US Renal  11/01/2015  CLINICAL DATA:  Acute onset of severe right flank pain. Initial encounter. EXAM: RENAL / URINARY TRACT ULTRASOUND COMPLETE COMPARISON:  None. FINDINGS: Right Kidney: Length: 12.4 cm. Echogenicity within normal limits. No mass visualized. Mild right-sided hydronephrosis is noted. Left Kidney: Length: 12.4 cm. Echogenicity within normal limits. No mass or hydronephrosis visualized. Bladder: Appears normal for degree of bladder distention. The right-sided ureteral jet is visualized. IMPRESSION: 1. Mild right-sided hydronephrosis likely reflects physiologic hydronephrosis of pregnancy. The right-sided ureteral jet is visualized. 2. Otherwise unremarkable renal ultrasound. Electronically Signed   By: Roanna Raider M.D.   On: 11/01/2015 02:32     MAU Course  Procedures  MDM 0235: Patient has had 0.5mg  of diladud she reports her pain is now 7/10, was 10/10  0242: D/W Dr. Ellyn Hack, admit to ante for pain control.   Assessment and Plan  Nephrolithiasis Admit to antenatal Routine orders Dilaudid for pain control, phenergan PRN NST q shift  Tawnya Crook 11/01/2015, 1:38 AM

## 2015-11-02 LAB — URINE CULTURE

## 2015-11-12 ENCOUNTER — Observation Stay (HOSPITAL_COMMUNITY)
Admission: AD | Admit: 2015-11-12 | Discharge: 2015-11-12 | Disposition: A | Discharge status: Home / Self Care | Payer: Medicaid Other | Source: Ambulatory Visit | Attending: Obstetrics and Gynecology | Admitting: Obstetrics and Gynecology

## 2015-11-12 ENCOUNTER — Encounter (HOSPITAL_COMMUNITY): Payer: Self-pay | Admitting: *Deleted

## 2015-11-12 DIAGNOSIS — O99891 Other specified diseases and conditions complicating pregnancy: Secondary | ICD-10-CM

## 2015-11-12 DIAGNOSIS — O26833 Pregnancy related renal disease, third trimester: Principal | ICD-10-CM | POA: Insufficient documentation

## 2015-11-12 DIAGNOSIS — N200 Calculus of kidney: Secondary | ICD-10-CM | POA: Insufficient documentation

## 2015-11-12 DIAGNOSIS — Z87891 Personal history of nicotine dependence: Secondary | ICD-10-CM | POA: Diagnosis not present

## 2015-11-12 DIAGNOSIS — Z3A35 35 weeks gestation of pregnancy: Secondary | ICD-10-CM | POA: Insufficient documentation

## 2015-11-12 LAB — URINE MICROSCOPIC-ADD ON

## 2015-11-12 LAB — URINALYSIS, ROUTINE W REFLEX MICROSCOPIC
Bilirubin Urine: NEGATIVE
GLUCOSE, UA: NEGATIVE mg/dL
KETONES UR: NEGATIVE mg/dL
Leukocytes, UA: NEGATIVE
Nitrite: NEGATIVE
PROTEIN: NEGATIVE mg/dL
Specific Gravity, Urine: 1.02 (ref 1.005–1.030)
pH: 6 (ref 5.0–8.0)

## 2015-11-12 LAB — CBC
HCT: 34.9 % — ABNORMAL LOW (ref 36.0–46.0)
Hemoglobin: 12 g/dL (ref 12.0–15.0)
MCH: 30.8 pg (ref 26.0–34.0)
MCHC: 34.4 g/dL (ref 30.0–36.0)
MCV: 89.7 fL (ref 78.0–100.0)
PLATELETS: 201 10*3/uL (ref 150–400)
RBC: 3.89 MIL/uL (ref 3.87–5.11)
RDW: 13.8 % (ref 11.5–15.5)
WBC: 11.8 10*3/uL — AB (ref 4.0–10.5)

## 2015-11-12 LAB — COMPREHENSIVE METABOLIC PANEL
ALBUMIN: 3.1 g/dL — AB (ref 3.5–5.0)
ALT: 10 U/L — AB (ref 14–54)
AST: 13 U/L — AB (ref 15–41)
Alkaline Phosphatase: 116 U/L (ref 38–126)
Anion gap: 8 (ref 5–15)
BUN: 15 mg/dL (ref 6–20)
CHLORIDE: 106 mmol/L (ref 101–111)
CO2: 20 mmol/L — AB (ref 22–32)
CREATININE: 0.72 mg/dL (ref 0.44–1.00)
Calcium: 8.9 mg/dL (ref 8.9–10.3)
GFR calc Af Amer: >60 mL/min (ref >60)
GLUCOSE: 88 mg/dL (ref 65–99)
Potassium: 4 mmol/L (ref 3.5–5.1)
Sodium: 134 mmol/L — ABNORMAL LOW (ref 135–145)
Total Bilirubin: 0.6 mg/dL (ref 0.3–1.2)
Total Protein: 6.2 g/dL — ABNORMAL LOW (ref 6.5–8.1)

## 2015-11-12 LAB — TYPE AND SCREEN
ABO/RH(D): A POS
Antibody Screen: NEGATIVE

## 2015-11-12 MED ORDER — PRENATAL MULTIVITAMIN CH
1.0000 | ORAL_TABLET | Freq: Every day | ORAL | Status: DC
Start: 2015-11-12 — End: 2015-11-13
  Administered 2015-11-12: 1 via ORAL
  Filled 2015-11-12 (×2): qty 1

## 2015-11-12 MED ORDER — ACETAMINOPHEN 325 MG PO TABS: 650.0000 mg | ORAL_TABLET | ORAL | Status: DC | PRN

## 2015-11-12 MED ORDER — HYDROMORPHONE HCL 1 MG/ML IJ SOLN
1.0000 mg | INTRAMUSCULAR | Status: DC | PRN
  Administered 2015-11-12 (×2): 1 mg via INTRAVENOUS
  Filled 2015-11-12 (×2): qty 1

## 2015-11-12 MED ORDER — ZOLPIDEM TARTRATE 5 MG PO TABS: 5.0000 mg | ORAL_TABLET | Freq: Every evening | ORAL | Status: DC | PRN

## 2015-11-12 MED ORDER — SODIUM CHLORIDE 0.9 % IV BOLUS (SEPSIS)
1000.0000 mL | Freq: Once | INTRAVENOUS | Status: AC
  Administered 2015-11-12: 1000 mL via INTRAVENOUS

## 2015-11-12 MED ORDER — NIFEDIPINE 10 MG PO CAPS
10.0000 mg | ORAL_CAPSULE | ORAL | Status: AC | PRN
  Administered 2015-11-12 (×4): 10 mg via ORAL
  Filled 2015-11-12 (×4): qty 1

## 2015-11-12 MED ORDER — ONDANSETRON HCL 4 MG/2ML IJ SOLN: 4.0000 mg | INTRAMUSCULAR | Status: DC | PRN

## 2015-11-12 MED ORDER — HYDROMORPHONE HCL 2 MG PO TABS
2.0000 mg | ORAL_TABLET | ORAL | Status: DC | PRN
  Administered 2015-11-12: 2 mg via ORAL
  Filled 2015-11-12: qty 1

## 2015-11-12 MED ORDER — ONDANSETRON HCL 4 MG/2ML IJ SOLN
4.0000 mg | Freq: Once | INTRAMUSCULAR | Status: AC
  Administered 2015-11-12: 4 mg via INTRAVENOUS
  Filled 2015-11-12: qty 2

## 2015-11-12 MED ORDER — HYDROMORPHONE HCL 2 MG PO TABS: 2.0000 mg | ORAL_TABLET | ORAL | Status: DC | PRN

## 2015-11-12 MED ORDER — CALCIUM CARBONATE ANTACID 500 MG PO CHEW
2.0000 | CHEWABLE_TABLET | ORAL | Status: DC | PRN
  Filled 2015-11-12: qty 2

## 2015-11-12 MED ORDER — DOCUSATE SODIUM 100 MG PO CAPS
100.0000 mg | ORAL_CAPSULE | Freq: Every day | ORAL | Status: DC
  Administered 2015-11-12: 100 mg via ORAL
  Filled 2015-11-12 (×2): qty 1

## 2015-11-12 MED ORDER — HYDROMORPHONE HCL 1 MG/ML IJ SOLN
1.0000 mg | Freq: Once | INTRAMUSCULAR | Status: AC
  Administered 2015-11-12: 1 mg via INTRAVENOUS
  Filled 2015-11-12: qty 1

## 2015-11-12 MED ORDER — HYDROMORPHONE HCL 1 MG/ML IJ SOLN: 1.0000 mg | INTRAMUSCULAR | Status: DC | PRN

## 2015-11-12 MED ORDER — PHENAZOPYRIDINE HCL 100 MG PO TABS
100.0000 mg | ORAL_TABLET | Freq: Three times a day (TID) | ORAL | Status: DC | PRN
  Filled 2015-11-12: qty 1

## 2015-11-12 MED ORDER — LACTATED RINGERS IV SOLN
INTRAVENOUS | Status: DC
  Administered 2015-11-12 (×2): via INTRAVENOUS

## 2015-11-12 NOTE — Discharge Summary (Signed)
Obstetric Discharge Summary Reason for Admission: pain control, nephrolithiasis Prenatal Procedures: NST Intrapartum Procedures: N/A Postpartum Procedures: N/A Complications-Operative and Postpartum: none HEMOGLOBIN  Date Value Ref Range Status  11/12/2015 12.0 12.0 - 15.0 g/dL Final  16/10/960412/14/2016 54.014.8 g/dL Final   HCT  Date Value Ref Range Status  11/12/2015 34.9* 36.0 - 46.0 % Final  05/16/2015 42 % Final    Physical Exam:  General: alert and no distress Lochia: appropriate Uterine Fundus: gravid, NT   Discharge Diagnoses: IUP at 35+, pain control, likely nephrolithiasis  Discharge Information: Date: 11/12/2015 Activity: unrestricted Diet: routine Medications: PNV and Flomax, Dilaudid Condition: stable Instructions: refer to practice specific booklet Discharge to: home Follow-up Information    Follow up with Zenaida NieceMEISINGER,TODD D, MD.   Specialty:  Obstetrics and Gynecology   Why:  As scheduled Wednesday   Contact information:   [address], [address] [place] [address] [phone]       Bovard-Stuckert, Augusto GambleJody 11/12/2015, 9:34 PM

## 2015-11-12 NOTE — MAU Note (Signed)
Patient endorses taking prescribed Dilaudid 30 minutes ago.

## 2015-11-12 NOTE — Progress Notes (Signed)
Pt ambulated off unit for discharge with family at side. No further concerns at this time.

## 2015-11-12 NOTE — Progress Notes (Signed)
Patient ID: Tonya [name], female   DOB: 1980-08-03, 35 y.o.   MRN: [id number] Tonya [name] is a 35 y.o. female [id number] (EDD 12/16/15 by LMP c/w 9 week US)  at 35 1/7 weeks  with likely nephrolithiasis now on her third admission for pain control.  Previously admitted 10/23/15 and 11/01/15 for severe flank pain and was sent home with po dilaudid once stabilized.  Also instructed to get flomax.   Admitted with 10/10 flank pain early this AM uncontrolled with her po dilaudid, then had N/V and could not keep medication down.  Large hemoglobin in urine with BW WNL.  Urine culture sent but has been negative on other 2 admissions. Pt was contracting on admission but these resolved with IV fluid and po procardia.  Cervix long and closed.  Prenatal care significant otherwise for prior c-section x 2, for scheduled repeat.   Desires BTL, has signed papers.  Maternal Medical History:  Fetal activity: Perceived fetal activity is normal.   Prenatal complications: Nephrolithiasis.  Prenatal Complications - Diabetes: none.   OB History    Gravida Para Term Preterm AB TAB SAB Ectopic Multiple Living   4 2 2  1  1   2     G1 LTCS 8#4 female G2 SAB G3 LTCS 7#2 female G4 present No abn pap No STDs  Past Medical History  Diagnosis Date  . Heart palpitations   eczema  Past Surgical History  Procedure Laterality Date  . Cesarean section x 2    . Dilation and curettage of uterus     Family History: family history is not on file. Social History:  reports that she has quit smoking. Her smoking use included Cigarettes. She smoked 1.00 pack per day. She quit smokeless tobacco use about 12 years ago. She reports that she does not drink alcohol or use illicit drugs.  Meds PNV, Diclegis All PCN, [place]   Prenatal Transfer Tool  Maternal Diabetes: No Genetic Screening: Normal Maternal Ultrasounds/Referrals: Normal Fetal Ultrasounds or other Referrals:  None Maternal Substance Abuse: Yes: Type: Other: quit smoking with pregnancy Significant Maternal Medications: None Significant Maternal Lab Results: None Other Comments: None  Review of Systems  Constitutional: Negative.  HENT: Negative.  Eyes: Negative.  Respiratory: Negative.  Cardiovascular: Negative.  Gastrointestinal: Negative.  Genitourinary: Positive for dysuria, hematuria and flank pain.  Musculoskeletal: Positive for back pain.  Skin: Negative.  Neurological: Negative.  Psychiatric/Behavioral: Negative.      Blood pressure 99/62, pulse 83, temperature 97.7 F (36.5 C), temperature source Oral, resp. rate 16, height 5\' 3"  (1.6 m), weight 80.741 kg (178 lb), SpO2 100 %. Maternal Exam:  Abdomen: Patient reports no abdominal tenderness. Fundal height is appropriate for gestation.     Physical Exam  Constitutional: She is oriented to person, place, and time. She appears well-developed and well-nourished.  HENT:  Head: Normocephalic and atraumatic.  Cardiovascular: Normal rate and regular rhythm.  Respiratory: Effort normal and breath sounds normal. No respiratory distress. She has no wheezes.  GI: Soft. Bowel sounds are normal. She exhibits no distension. There is no tenderness.  Flank pain  Musculoskeletal: Normal range of motion.  Neurological: She is alert and oriented to person, place, and time.  Skin: Skin is warm and dry.  Psychiatric: She has a normal mood and affect. Her behavior is normal.   CBC and CMP essentially normal   Prenatal labs: ABO, Rh: --/--/A POS (06/01 0135) Antibody: NEG (06/01 0135) Rubella: Immune (12/14 0000) RPR:  NR HBsAg: Negative (12/14 0000)  HIV: Non-reactive (12/14 0000)  GBS:   Unknown First trimester screen WNL One hour GCT 126      Assessment/Plan: Pt with third admission now due to flank pain, presumed nephrolithiasis.  Received total of 2 mg dilaudid in MAU and still rated pain a 10/10  although appeared more comfortable.  Some contractions that resolved and cervix long/closed.  FHR Category one.  Normal renal US 11/01/15.  Will admit to observation and hopefully get pain back under control.  Will consider repeat renal US if does not respond quickly.

## 2015-11-12 NOTE — Progress Notes (Signed)
Patient ID: Tonya [name], female   DOB: [birth year], 35 y.o.   MRN: [id number]  35+wks  with pain control likely neprolithiasis, +FM, no LOF, no VB, no ctx  AFVSS gen sleepy, but appropriate Abd soft, gravid, FNT FHTs reassuring CVAT R  Continue current mgmt

## 2015-11-12 NOTE — MAU Provider Note (Signed)
Chief Complaint:  Abdominal Pain and Back Pain   First Provider Initiated Contact with Patient 11/12/15 0238     HPI: Tonya [name] is a 35 y.o. [id number] at [redacted]w[redacted]d who presents to maternity admissions reporting severe right flank pain wrapping around to low abdominal pain consistent with kidney stone pain that she's had in the past. States it feels different this time and is more in the low abdomen. Upon further questioning she has noticed that her uterus tightening frequently. Has been hospitalized twice this pregnancy for presumed kidney stones. Has prescriptions for Dilaudid and Flomax. Took Dilaudid 2 mg tablet 30 minutes before coming to maternity admissions but was vomiting repeatedly upon arrival.  Location: Right flank and low abdomen Quality: Sharp Severity: 10/10 in pain scale Duration: Ongoing times several weeks but much worse over the last few hours Course: Worsening Timing: Intermittent Modifying factors: Dilaudid had been helping, but she couldn't keep it down tonight. Associated signs and symptoms: Positive for dysuria. Negative for fever, chills, hematuria, vaginal bleeding. States she has been feeling some dampness in her underwear "a while". No frank leaking of fluid.  Good fetal movement.   Past Medical History: Past Medical History  Diagnosis Date  . Heart palpitations     Past obstetric history: OB History  Gravida Para Term Preterm AB SAB TAB Ectopic Multiple Living  # Outcome Date GA Lbr Len/2nd Weight Sex Delivery Anes PTL Lv  4 Current           3 Term      CS-Unspec     2 Term      CS-Unspec     1 SAB               Past Surgical History: Past Surgical History  Procedure Laterality Date  . Cesarean section    . Dilation and curettage of uterus       Family History: History reviewed. No pertinent family history.  Social History: Social History  Substance Use Topics  . Smoking status: Former Smoker -- 1.00 packs/day     Types: Cigarettes  . Smokeless tobacco: Former Neurosurgeon    Quit date: 04/17/2003  . Alcohol Use: No    Allergies:  Allergies  Allergen Reactions  . Penicillins Hives    Has patient had a PCN reaction causing immediate rash, facial/tongue/throat swelling, SOB or lightheadedness with hypotension: Yes Has patient had a PCN reaction causing severe rash involving mucus membranes or skin necrosis: No Has patient had a PCN reaction that required hospitalization No Has patient had a PCN reaction occurring within the last 10 years: No If all of the above answers are "NO", then may proceed with Cephalosporin use.     Meds:  Prescriptions prior to admission  Medication Sig Dispense Refill Last Dose  . calcium carbonate (TUMS - DOSED IN MG ELEMENTAL CALCIUM) 500 MG chewable tablet Chew 4 tablets by mouth 3 (three) times daily as needed for indigestion or heartburn.   10/31/2015 at Unknown time  . cyclobenzaprine (FLEXERIL) 5 MG tablet Take 1 tablet (5 mg total) by mouth 3 (three) times daily. 10 tablet 0 Past Week at Unknown time  . Doxylamine-Pyridoxine (DICLEGIS) 10-10 MG TBEC Take 2 tablets by mouth at bedtime as needed (for nausea/vomiting).   Past Week at Unknown time  . HYDROmorphone (DILAUDID) 2 MG tablet Take 1 tablet (2 mg total) by mouth every 4 (four) hours as  needed for severe pain. 30 tablet 0   . nitrofurantoin, macrocrystal-monohydrate, (MACROBID) 100 MG capsule Take 1 capsule (100 mg total) by mouth every 12 (twelve) hours. 13 capsule 0 Past Week at Unknown time  . phenazopyridine (PYRIDIUM) 100 MG tablet Take 1 tablet (100 mg total) by mouth 3 (three) times daily as needed for pain. 10 tablet 0 Past Week at Unknown time  . traMADol (ULTRAM) 50 MG tablet Take 1 tablet (50 mg total) by mouth every 6 (six) hours. 10 tablet 0 Past Week at Unknown time    I have reviewed patient's Past Medical Hx, Surgical Hx, Family Hx, Social Hx, medications and allergies.   ROS:  Review of Systems   Constitutional: Negative for fever and chills.  Gastrointestinal: Positive for nausea, vomiting and abdominal pain. Negative for diarrhea and constipation.  Genitourinary: Positive for dysuria, frequency, flank pain and pelvic pain. Negative for urgency, hematuria, vaginal bleeding and difficulty urinating.  Musculoskeletal: Positive for back pain.    Physical Exam  Patient Vitals for the past 24 hrs:  BP Temp Temp src Pulse Resp SpO2 Height Weight  11/12/15 0250 108/60 mmHg - - 68 19 100 % 5\' 3"  (1.6 m) 178 lb (80.74 kg)  11/12/15 0112 103/65 mmHg 98.4 F (36.9 C) Oral 81 19 100 % - -   Constitutional: Well-developed, well-nourished female in severe distress, crying.  Cardiovascular: normal rate Respiratory: normal effort GI: Abd soft, non-tender, no rebound tenderness,gravid appropriate for gestational age. Pos BS x 4 MS: Extremities nontender, no edema, normal ROM Neurologic: Alert and oriented x 4.  GU: severe right CVAT.  Pelvic: NEFG, scant physiologic discharge, no leaking of fluid or  Blood.  Dilation: Closed Effacement (%): Thick Cervical Position: Posterior Station: Ballotable Presentation: Undeterminable Exam by::  Ivonne Andrew, CNM)  FHT:  Baseline 125 , moderate variability, accelerations present, no decelerations Contractions: q 2-4 mins, moderate   Labs: Results for orders placed or performed during the hospital encounter of 11/12/15 (from the past 24 hour(s))  Urinalysis, Routine w reflex microscopic (not at [facility])     Status: Abnormal   Collection Time: 11/12/15  1:19 AM  Result Value Ref Range   Color, Urine YELLOW YELLOW   APPearance HAZY (A) CLEAR   Specific Gravity, Urine 1.020 1.005 - 1.030   pH 6.0 5.0 - 8.0   Glucose, UA NEGATIVE NEGATIVE mg/dL   Hgb urine dipstick LARGE (A) NEGATIVE   Bilirubin Urine NEGATIVE NEGATIVE   Ketones, ur NEGATIVE NEGATIVE mg/dL   Protein, ur NEGATIVE NEGATIVE mg/dL   Nitrite NEGATIVE NEGATIVE   Leukocytes, UA NEGATIVE  NEGATIVE  Urine microscopic-add on     Status: Abnormal   Collection Time: 11/12/15  1:19 AM  Result Value Ref Range   Squamous Epithelial / LPF 6-30 (A) NONE SEEN   WBC, UA 0-5 0 - 5 WBC/hpf   RBC / HPF 6-30 0 - 5 RBC/hpf   Bacteria, UA FEW (A) NONE SEEN   Crystals CA OXALATE CRYSTALS (A) NEGATIVE   Urine-Other MUCOUS PRESENT     Imaging:  US Renal  11/01/2015  CLINICAL DATA:  Acute onset of severe right flank pain. Initial encounter. EXAM: RENAL / URINARY TRACT ULTRASOUND COMPLETE COMPARISON:  None. FINDINGS: Right Kidney: Length: 12.4 cm. Echogenicity within normal limits. No mass visualized. Mild right-sided hydronephrosis is noted. Left Kidney: Length: 12.4 cm. Echogenicity within normal limits. No mass or hydronephrosis visualized. Bladder: Appears normal for degree of bladder distention. The right-sided ureteral jet is visualized.  IMPRESSION: 1. Mild right-sided hydronephrosis likely reflects physiologic hydronephrosis of pregnancy. The right-sided ureteral jet is visualized. 2. Otherwise unremarkable renal ultrasound. Electronically Signed   By: Roanna RaiderJeffery  Chang M.D.   On: 11/01/2015 02:32   [place] Renal  10/23/2015  CLINICAL DATA:  Subacute onset of dysuria, hematuria and generalized abdominal pain and back pain. Initial encounter. EXAM: RENAL / URINARY TRACT ULTRASOUND COMPLETE COMPARISON:  None. FINDINGS: Right Kidney: Length: 12.0 cm. Echogenicity within normal limits. No mass or hydronephrosis visualized. Left Kidney: Length: 12.6 cm. Echogenicity within normal limits. No mass or hydronephrosis visualized. Bladder: Appears normal for degree of bladder distention. Bilateral ureteral jets are visualized. IMPRESSION: Unremarkable renal ultrasound. Electronically Signed   By: Roanna RaiderJeffery  Chang M.D.   On: 10/23/2015 21:14    MAU Course: UA, Zofran, 1 mg Dilaudid, urine culture, saline bolus.  No improvement in pain. Patient having frequent contractions. 1 mg Dilaudid and Procardia ordered.  Second liter of saline ordered.  No improvement in pain per pt, although she is dozing off. No cervical change. UC's mostly resolved, now just UI. Discussed Hx, exam, labs w/ Dr. Senaida Oresichardson. Will Obs on Antenatal for pain management, PTL obs.   MDM: - Presumed nephrolithiasis w/ exacerbation of pain, unable to keep down pain meds due to N/V.  - Preterm contractions w/out evidence of active PTL. NICU notified.    Assessment: Presumed Nephrolithiasis  Preterm contractions. Previous C/S x 2  Plan: 23 hour Obs on Antenatal per consult w/ Dr. Senaida Oresichardson. Urine Culture pending Dilaudid Zofran  Dorathy KinsmanVirginia Lamarcus Spira, CNM 11/12/2015 3:08 AM

## 2015-11-12 NOTE — MAU Note (Signed)
Patient presents to mau with c/o right sided back pain that radiates to RLQ; patient endorses a history of kidney stones and states has been seen 3 times this month for kidney stones.

## 2015-11-12 NOTE — Progress Notes (Signed)
Patient ID: Tonya NeptuneShannon M Hard, female   DOB: November 11, 1980, 35 y.o.   MRN: [id number]  Pt states pain better controlled.  Desires d/c to home.  Has appt scheduled for Wednesday.    FHTs 125, good var, category 1 toco irritability  Will d/c with dilaudid po.

## 2015-11-15 LAB — CULTURE, OB URINE: SPECIAL REQUESTS: NORMAL

## 2015-11-30 ENCOUNTER — Encounter (HOSPITAL_COMMUNITY): Payer: Self-pay

## 2015-12-07 ENCOUNTER — Encounter (HOSPITAL_COMMUNITY)
Admission: RE | Admit: 2015-12-07 | Discharge: 2015-12-07 | Disposition: A | Discharge status: Home / Self Care | Payer: Medicaid Other | Source: Ambulatory Visit | Attending: Obstetrics and Gynecology | Admitting: Obstetrics and Gynecology

## 2015-12-07 DIAGNOSIS — Z01812 Encounter for preprocedural laboratory examination: Secondary | ICD-10-CM | POA: Diagnosis present

## 2015-12-07 HISTORY — DX: Calculus of kidney: N20.0

## 2015-12-07 HISTORY — DX: Calculus of kidney: O26.839

## 2015-12-07 LAB — CBC
HCT: 36.3 % (ref 36.0–46.0)
Hemoglobin: 12.5 g/dL (ref 12.0–15.0)
MCH: 30.5 pg (ref 26.0–34.0)
MCHC: 34.4 g/dL (ref 30.0–36.0)
MCV: 88.5 fL (ref 78.0–100.0)
PLATELETS: 197 10*3/uL (ref 150–400)
RBC: 4.1 MIL/uL (ref 3.87–5.11)
RDW: 13.9 % (ref 11.5–15.5)
WBC: 9 10*3/uL (ref 4.0–10.5)

## 2015-12-07 LAB — TYPE AND SCREEN
ABO/RH(D): A POS
Antibody Screen: NEGATIVE

## 2015-12-07 NOTE — Patient Instructions (Signed)
[name]  12/07/2015   Your procedure is scheduled on:  12/10/2015  [facility] through [facility]tr[facility] at 0600 AM.  Pick up the phone at the desk and dial [phone].   Call this number if you have problems the morning of surgery: [phone]   Remember:   Do not eat food:After Midnight.  Do not drink clear liquids: After Midnight.  Take these medicines the morning of surgery with A SIP OF WATER: none   Do not wear jewelry, make-up or nail polish.  Do not wear lotions, powders, or perfumes. You may wear deodorant.  Do not shave 48 hours prior to surgery.  Do not bring valuables to the hospital.  [facility] is not   responsible for any belongings or valuables brought to the hospital.  Contacts, dentures or bridgework may not be worn into surgery.  Leave suitcase in the car. After surgery it may be brought to your room.  For patients admitted to the hospital, checkout time is 11:00 AM the day of              discharge.   Patients discharged the day of surgery will not be allowed to drive             home.  Name and phone number of your driver: na  Special Instructions:   N/A   Please read over the following fact sheets that you were given:   Surgical Site Infection Prevention

## 2015-12-08 LAB — RPR: RPR: NONREACTIVE

## 2015-12-09 NOTE — H&P (Signed)
Tonya [name] is a 35 y.o. female, G4 56P2012, EGA [redacted] weeks with EDC 7-16 presenting for repeat c-section and BTL.  Previous c-section x 2, desires repeat, wants permanent sterility.  Prenatal care essentially uncomplicated otherwise.  Maternal Medical History:  Fetal activity: Perceived fetal activity is normal.    Prenatal complications: no prenatal complications   OB History    Gravida Para Term Preterm AB TAB SAB Ectopic Multiple Living   4 2 2  1  1   2     C-section x 2  Past Medical History  Diagnosis Date  . Heart palpitations   . Kidney stone complicating pregnancy    Past Surgical History  Procedure Laterality Date  . Cesarean section    . Dilation and curettage of uterus     Family History: family history is negative for Alcohol abuse, Arthritis, Asthma, Birth defects, Cancer, COPD, Depression, Diabetes, Drug abuse, Early death, Hearing loss, Heart disease, Hyperlipidemia, Hypertension, Kidney disease, Learning disabilities, Mental illness, Mental retardation, Miscarriages / Stillbirths, Stroke, Vision loss, and Varicose Veins. Social History:  reports that she has quit smoking. Her smoking use included Cigarettes. She smoked 1.00 pack per day. She quit smokeless tobacco use about 12 years ago. She reports that she does not drink alcohol or use illicit drugs.   Prenatal Transfer Tool  Maternal Diabetes: No Genetic Screening: Normal Maternal Ultrasounds/Referrals: Normal Fetal Ultrasounds or other Referrals:  None Maternal Substance Abuse:  No Significant Maternal Medications:  None Significant Maternal Lab Results:  Lab values include: Group B Strep negative Other Comments:  None  Review of Systems  Respiratory: Negative.   Cardiovascular: Negative.       There were no vitals taken for this visit. Maternal Exam:  Abdomen: Patient reports no abdominal tenderness. Surgical scars: low transverse.   Estimated fetal weight is 7 1/2 lbs.   Fetal presentation:  vertex  Introitus: Normal vulva. Normal vagina.  Amniotic fluid character: not assessed.  Cervix: Cervix evaluated by digital exam.     Physical Exam  Constitutional: She appears well-developed and well-nourished.  Neck: Neck supple. No thyromegaly present.  Cardiovascular: Normal rate, regular rhythm and normal heart sounds.   No murmur heard. Respiratory: Effort normal and breath sounds normal. No respiratory distress. She has no wheezes.  GI: Soft.    Prenatal labs: ABO, Rh: --/--/A POS (07/07 1140) Antibody: NEG (07/07 1140) Rubella: Immune (12/14 0000) RPR: Non Reactive (07/07 1140)  HBsAg: Negative (12/14 0000)  HIV: Non-reactive (12/14 0000)  GBS:   Neg  Assessment/Plan: IUP at 39 weeks, previous c-section x 2, desires BTL.  Discussed c-section procedure and risks, discussed risks, permanency and failure rate of BTL.  Will admit for repeat c-section and bilateral tubal ligation.   Tonya [name] D 12/09/2015, 7:47 PM

## 2015-12-10 ENCOUNTER — Encounter (HOSPITAL_COMMUNITY): Payer: Self-pay

## 2015-12-10 ENCOUNTER — Inpatient Hospital Stay (HOSPITAL_COMMUNITY): Payer: Medicaid Other | Admitting: Anesthesiology

## 2015-12-10 ENCOUNTER — Inpatient Hospital Stay (HOSPITAL_COMMUNITY)
Admission: RE | Admit: 2015-12-10 | Discharge: 2015-12-12 | DRG: 766 | Disposition: A | Discharge status: Home / Self Care | Payer: Medicaid Other | Source: Ambulatory Visit | Attending: Obstetrics and Gynecology | Admitting: Obstetrics and Gynecology

## 2015-12-10 ENCOUNTER — Encounter (HOSPITAL_COMMUNITY)
Admission: RE | Disposition: A | Discharge status: Home / Self Care | Payer: Self-pay | Source: Ambulatory Visit | Attending: Obstetrics and Gynecology

## 2015-12-10 DIAGNOSIS — Z302 Encounter for sterilization: Secondary | ICD-10-CM | POA: Diagnosis not present

## 2015-12-10 DIAGNOSIS — Z98891 History of uterine scar from previous surgery: Secondary | ICD-10-CM

## 2015-12-10 DIAGNOSIS — Z3A39 39 weeks gestation of pregnancy: Secondary | ICD-10-CM

## 2015-12-10 DIAGNOSIS — Z87442 Personal history of urinary calculi: Secondary | ICD-10-CM | POA: Diagnosis not present

## 2015-12-10 DIAGNOSIS — Z87891 Personal history of nicotine dependence: Secondary | ICD-10-CM | POA: Diagnosis not present

## 2015-12-10 DIAGNOSIS — O34211 Maternal care for low transverse scar from previous cesarean delivery: Principal | ICD-10-CM | POA: Diagnosis present

## 2015-12-10 SURGERY — Surgical Case
Anesthesia: Spinal | Laterality: Bilateral

## 2015-12-10 MED ORDER — TETANUS-DIPHTH-ACELL PERTUSSIS 5-2.5-18.5 LF-MCG/0.5 IM SUSP: 0.5000 mL | Freq: Once | INTRAMUSCULAR | Status: DC

## 2015-12-10 MED ORDER — MORPHINE SULFATE (PF) 0.5 MG/ML IJ SOLN
INTRAMUSCULAR | Status: AC
  Filled 2015-12-10: qty 10

## 2015-12-10 MED ORDER — SIMETHICONE 80 MG PO CHEW
80.0000 mg | CHEWABLE_TABLET | ORAL | Status: DC | PRN
  Administered 2015-12-11 (×2): 80 mg via ORAL
  Filled 2015-12-10 (×2): qty 1

## 2015-12-10 MED ORDER — SCOPOLAMINE 1 MG/3DAYS TD PT72
MEDICATED_PATCH | TRANSDERMAL | Status: AC
  Administered 2015-12-10: 1.5 mg via TRANSDERMAL
  Filled 2015-12-10: qty 1

## 2015-12-10 MED ORDER — LACTATED RINGERS IV SOLN
INTRAVENOUS | Status: DC
  Administered 2015-12-10 – 2015-12-11 (×3): via INTRAVENOUS

## 2015-12-10 MED ORDER — ACETAMINOPHEN 325 MG PO TABS
650.0000 mg | ORAL_TABLET | ORAL | Status: DC | PRN
  Administered 2015-12-10 – 2015-12-12 (×9): 650 mg via ORAL
  Filled 2015-12-10 (×9): qty 2

## 2015-12-10 MED ORDER — MAGNESIUM HYDROXIDE 400 MG/5ML PO SUSP: 30.0000 mL | ORAL | Status: DC | PRN

## 2015-12-10 MED ORDER — SCOPOLAMINE 1 MG/3DAYS TD PT72
1.0000 | MEDICATED_PATCH | Freq: Once | TRANSDERMAL | Status: DC
  Administered 2015-12-10: 1.5 mg via TRANSDERMAL

## 2015-12-10 MED ORDER — KETOROLAC TROMETHAMINE 30 MG/ML IJ SOLN
30.0000 mg | Freq: Four times a day (QID) | INTRAMUSCULAR | Status: AC | PRN
  Administered 2015-12-10: 30 mg via INTRAMUSCULAR

## 2015-12-10 MED ORDER — FENTANYL CITRATE (PF) 100 MCG/2ML IJ SOLN
INTRAMUSCULAR | Status: AC
  Filled 2015-12-10: qty 2

## 2015-12-10 MED ORDER — DIPHENHYDRAMINE HCL 25 MG PO CAPS: 25.0000 mg | ORAL_CAPSULE | ORAL | Status: DC | PRN

## 2015-12-10 MED ORDER — KETOROLAC TROMETHAMINE 30 MG/ML IJ SOLN
30.0000 mg | Freq: Four times a day (QID) | INTRAMUSCULAR | Status: AC | PRN
  Administered 2015-12-10: 30 mg via INTRAVENOUS
  Filled 2015-12-10: qty 1

## 2015-12-10 MED ORDER — WITCH HAZEL-GLYCERIN EX PADS: 1.0000 "application " | MEDICATED_PAD | CUTANEOUS | Status: DC | PRN

## 2015-12-10 MED ORDER — LACTATED RINGERS IV SOLN: INTRAVENOUS | Status: DC

## 2015-12-10 MED ORDER — ONDANSETRON HCL 4 MG/2ML IJ SOLN: 4.0000 mg | Freq: Three times a day (TID) | INTRAMUSCULAR | Status: DC | PRN

## 2015-12-10 MED ORDER — OXYCODONE HCL 5 MG PO TABS
10.0000 mg | ORAL_TABLET | ORAL | Status: DC | PRN
  Administered 2015-12-11 – 2015-12-12 (×8): 10 mg via ORAL
  Filled 2015-12-10 (×8): qty 2

## 2015-12-10 MED ORDER — FENTANYL CITRATE (PF) 100 MCG/2ML IJ SOLN
INTRAMUSCULAR | Status: DC | PRN
  Administered 2015-12-10: 12.5 ug via INTRATHECAL

## 2015-12-10 MED ORDER — OXYTOCIN 40 UNITS IN LACTATED RINGERS INFUSION - SIMPLE MED
INTRAVENOUS | Status: DC | PRN
  Administered 2015-12-10: 40 [IU] via INTRAVENOUS

## 2015-12-10 MED ORDER — NALBUPHINE HCL 10 MG/ML IJ SOLN: 5.0000 mg | Freq: Once | INTRAMUSCULAR | Status: AC | PRN

## 2015-12-10 MED ORDER — PHENYLEPHRINE HCL 10 MG/ML IJ SOLN
INTRAMUSCULAR | Status: DC | PRN
  Administered 2015-12-10: 40 ug via INTRAVENOUS

## 2015-12-10 MED ORDER — LIDOCAINE HCL 1 % IJ SOLN
INTRAMUSCULAR | Status: AC
  Filled 2015-12-10: qty 20

## 2015-12-10 MED ORDER — NALBUPHINE HCL 10 MG/ML IJ SOLN: 5.0000 mg | INTRAMUSCULAR | Status: DC | PRN

## 2015-12-10 MED ORDER — ONDANSETRON HCL 4 MG/2ML IJ SOLN
INTRAMUSCULAR | Status: DC | PRN
  Administered 2015-12-10: 4 mg via INTRAVENOUS

## 2015-12-10 MED ORDER — LACTATED RINGERS IV SOLN
Freq: Once | INTRAVENOUS | Status: AC
  Administered 2015-12-10: 07:00:00 via INTRAVENOUS

## 2015-12-10 MED ORDER — NALBUPHINE HCL 10 MG/ML IJ SOLN
INTRAMUSCULAR | Status: AC
Start: 2015-12-10 — End: 2015-12-10
  Filled 2015-12-10: qty 1

## 2015-12-10 MED ORDER — DIBUCAINE 1 % RE OINT: 1.0000 "application " | TOPICAL_OINTMENT | RECTAL | Status: DC | PRN

## 2015-12-10 MED ORDER — NALOXONE HCL 2 MG/2ML IJ SOSY
1.0000 ug/kg/h | PREFILLED_SYRINGE | INTRAVENOUS | Status: DC | PRN
  Filled 2015-12-10: qty 2

## 2015-12-10 MED ORDER — COCONUT OIL OIL: 1.0000 "application " | TOPICAL_OIL | Status: DC | PRN

## 2015-12-10 MED ORDER — ZOLPIDEM TARTRATE 5 MG PO TABS: 5.0000 mg | ORAL_TABLET | Freq: Every evening | ORAL | Status: DC | PRN

## 2015-12-10 MED ORDER — IBUPROFEN 600 MG PO TABS
600.0000 mg | ORAL_TABLET | Freq: Four times a day (QID) | ORAL | Status: DC
  Administered 2015-12-11 – 2015-12-12 (×6): 600 mg via ORAL
  Filled 2015-12-10 (×6): qty 1

## 2015-12-10 MED ORDER — SODIUM CHLORIDE 0.9 % IR SOLN
Status: DC | PRN
  Administered 2015-12-10: 1000 mL

## 2015-12-10 MED ORDER — SCOPOLAMINE 1 MG/3DAYS TD PT72
1.0000 | MEDICATED_PATCH | Freq: Once | TRANSDERMAL | Status: DC
  Filled 2015-12-10: qty 1

## 2015-12-10 MED ORDER — PHENYLEPHRINE 8 MG IN D5W 100 ML (0.08MG/ML) PREMIX OPTIME
INJECTION | INTRAVENOUS | Status: AC
  Filled 2015-12-10: qty 100

## 2015-12-10 MED ORDER — DIPHENHYDRAMINE HCL 50 MG/ML IJ SOLN: 12.5000 mg | INTRAMUSCULAR | Status: DC | PRN

## 2015-12-10 MED ORDER — CEFAZOLIN SODIUM-DEXTROSE 2-4 GM/100ML-% IV SOLN
2.0000 g | INTRAVENOUS | Status: AC
  Administered 2015-12-10: 2 g via INTRAVENOUS

## 2015-12-10 MED ORDER — PRENATAL MULTIVITAMIN CH
1.0000 | ORAL_TABLET | Freq: Every day | ORAL | Status: DC
  Administered 2015-12-11: 1 via ORAL
  Filled 2015-12-10: qty 1

## 2015-12-10 MED ORDER — OXYTOCIN 40 UNITS IN LACTATED RINGERS INFUSION - SIMPLE MED: 2.5000 [IU]/h | INTRAVENOUS | Status: AC

## 2015-12-10 MED ORDER — METOCLOPRAMIDE HCL 5 MG/ML IJ SOLN
INTRAMUSCULAR | Status: DC | PRN
  Administered 2015-12-10 (×2): 5 mg via INTRAVENOUS

## 2015-12-10 MED ORDER — OXYTOCIN 10 UNIT/ML IJ SOLN
INTRAMUSCULAR | Status: AC
  Filled 2015-12-10: qty 4

## 2015-12-10 MED ORDER — ONDANSETRON HCL 4 MG/2ML IJ SOLN
INTRAMUSCULAR | Status: AC
  Filled 2015-12-10: qty 2

## 2015-12-10 MED ORDER — BUPIVACAINE IN DEXTROSE 0.75-8.25 % IT SOLN
INTRATHECAL | Status: DC | PRN
  Administered 2015-12-10: 1.4 mL via INTRATHECAL

## 2015-12-10 MED ORDER — PHENYLEPHRINE 40 MCG/ML (10ML) SYRINGE FOR IV PUSH (FOR BLOOD PRESSURE SUPPORT)
PREFILLED_SYRINGE | INTRAVENOUS | Status: AC
  Filled 2015-12-10: qty 10

## 2015-12-10 MED ORDER — MEASLES, MUMPS & RUBELLA VAC ~~LOC~~ INJ
0.5000 mL | INJECTION | Freq: Once | SUBCUTANEOUS | Status: DC
  Filled 2015-12-10: qty 0.5

## 2015-12-10 MED ORDER — LACTATED RINGERS IV SOLN
INTRAVENOUS | Status: DC | PRN
  Administered 2015-12-10: 08:00:00 via INTRAVENOUS

## 2015-12-10 MED ORDER — MORPHINE SULFATE (PF) 0.5 MG/ML IJ SOLN
INTRAMUSCULAR | Status: DC | PRN
  Administered 2015-12-10: .1 mg via INTRATHECAL

## 2015-12-10 MED ORDER — MENTHOL 3 MG MT LOZG: 1.0000 | LOZENGE | OROMUCOSAL | Status: DC | PRN

## 2015-12-10 MED ORDER — METOCLOPRAMIDE HCL 5 MG/ML IJ SOLN: 10.0000 mg | Freq: Once | INTRAMUSCULAR | Status: DC | PRN

## 2015-12-10 MED ORDER — LACTATED RINGERS IV BOLUS (SEPSIS)
1000.0000 mL | Freq: Once | INTRAVENOUS | Status: AC
  Administered 2015-12-10: 1000 mL via INTRAVENOUS

## 2015-12-10 MED ORDER — PHENYLEPHRINE 8 MG IN D5W 100 ML (0.08MG/ML) PREMIX OPTIME
INJECTION | INTRAVENOUS | Status: DC | PRN
Start: 2015-12-10 — End: 2015-12-10
  Administered 2015-12-10: 60 ug/min via INTRAVENOUS

## 2015-12-10 MED ORDER — MEPERIDINE HCL 25 MG/ML IJ SOLN: 6.2500 mg | INTRAMUSCULAR | Status: DC | PRN

## 2015-12-10 MED ORDER — FENTANYL CITRATE (PF) 100 MCG/2ML IJ SOLN: 25.0000 ug | INTRAMUSCULAR | Status: DC | PRN

## 2015-12-10 MED ORDER — LACTATED RINGERS IV SOLN
INTRAVENOUS | Status: DC
  Administered 2015-12-10 (×3): via INTRAVENOUS

## 2015-12-10 MED ORDER — OXYCODONE HCL 5 MG PO TABS
5.0000 mg | ORAL_TABLET | ORAL | Status: DC | PRN
Start: 2015-12-10 — End: 2015-12-12
  Administered 2015-12-10: 5 mg via ORAL
  Filled 2015-12-10: qty 1

## 2015-12-10 MED ORDER — DIPHENHYDRAMINE HCL 25 MG PO CAPS: 25.0000 mg | ORAL_CAPSULE | Freq: Four times a day (QID) | ORAL | Status: DC | PRN

## 2015-12-10 MED ORDER — KETOROLAC TROMETHAMINE 30 MG/ML IJ SOLN
INTRAMUSCULAR | Status: AC
  Filled 2015-12-10: qty 1

## 2015-12-10 MED ORDER — SODIUM CHLORIDE 0.9% FLUSH: 3.0000 mL | INTRAVENOUS | Status: DC | PRN

## 2015-12-10 MED ORDER — NALOXONE HCL 0.4 MG/ML IJ SOLN: 0.4000 mg | INTRAMUSCULAR | Status: DC | PRN

## 2015-12-10 MED ORDER — SENNOSIDES-DOCUSATE SODIUM 8.6-50 MG PO TABS
2.0000 | ORAL_TABLET | ORAL | Status: DC
  Administered 2015-12-11 (×2): 2 via ORAL
  Filled 2015-12-10 (×2): qty 2

## 2015-12-10 MED ORDER — NALBUPHINE HCL 10 MG/ML IJ SOLN
5.0000 mg | Freq: Once | INTRAMUSCULAR | Status: AC | PRN
  Administered 2015-12-10: 5 mg via INTRAVENOUS

## 2015-12-10 MED ORDER — METOCLOPRAMIDE HCL 5 MG/ML IJ SOLN
INTRAMUSCULAR | Status: AC
  Filled 2015-12-10: qty 2

## 2015-12-10 SURGICAL SUPPLY — 37 items
BENZOIN TINCTURE PRP APPL 2/3 (GAUZE/BANDAGES/DRESSINGS) ×3 IMPLANT
CLAMP CORD UMBIL (MISCELLANEOUS) IMPLANT
CLOSURE WOUND 1/2 X4 (GAUZE/BANDAGES/DRESSINGS) ×1
CLOTH BEACON ORANGE TIMEOUT ST (SAFETY) ×3 IMPLANT
CONTAINER PREFILL 10% NBF 15ML (MISCELLANEOUS) IMPLANT
DRSG OPSITE POSTOP 4X10 (GAUZE/BANDAGES/DRESSINGS) ×3 IMPLANT
DURAPREP 26ML APPLICATOR (WOUND CARE) ×3 IMPLANT
ELECT REM PT RETURN 9FT ADLT (ELECTROSURGICAL) ×3
ELECTRODE REM PT RTRN 9FT ADLT (ELECTROSURGICAL) ×1 IMPLANT
EXTRACTOR VACUUM KIWI (MISCELLANEOUS) IMPLANT
EXTRACTOR VACUUM M CUP 4 TUBE (SUCTIONS) IMPLANT
EXTRACTOR VACUUM M CUP 4' TUBE (SUCTIONS)
GLOVE BIO SURGEON STRL SZ8 (GLOVE) ×3 IMPLANT
GLOVE BIOGEL PI IND STRL 7.0 (GLOVE) ×1 IMPLANT
GLOVE BIOGEL PI INDICATOR 7.0 (GLOVE) ×2
GLOVE ORTHO TXT STRL SZ7.5 (GLOVE) ×3 IMPLANT
GOWN STRL REUS W/TWL LRG LVL3 (GOWN DISPOSABLE) ×6 IMPLANT
KIT ABG SYR 3ML LUER SLIP (SYRINGE) IMPLANT
NEEDLE HYPO 25X5/8 SAFETYGLIDE (NEEDLE) ×3 IMPLANT
NS IRRIG 1000ML POUR BTL (IV SOLUTION) ×3 IMPLANT
PACK C SECTION WH (CUSTOM PROCEDURE TRAY) ×3 IMPLANT
PAD OB MATERNITY 4.3X12.25 (PERSONAL CARE ITEMS) ×3 IMPLANT
PENCIL SMOKE EVAC W/HOLSTER (ELECTROSURGICAL) ×3 IMPLANT
RTRCTR C-SECT PINK 25CM LRG (MISCELLANEOUS) ×3 IMPLANT
STRIP CLOSURE SKIN 1/2X4 (GAUZE/BANDAGES/DRESSINGS) ×2 IMPLANT
SUT CHROMIC 1 CTX 36 (SUTURE) ×6 IMPLANT
SUT PLAIN 0 NONE (SUTURE) IMPLANT
SUT PLAIN 2 0 (SUTURE) ×2
SUT PLAIN 2 0 XLH (SUTURE) IMPLANT
SUT PLAIN ABS 2-0 CT1 27XMFL (SUTURE) ×1 IMPLANT
SUT VIC AB 0 CT1 27 (SUTURE) ×4
SUT VIC AB 0 CT1 27XBRD ANBCTR (SUTURE) ×2 IMPLANT
SUT VIC AB 2-0 CT1 27 (SUTURE) ×2
SUT VIC AB 2-0 CT1 TAPERPNT 27 (SUTURE) ×1 IMPLANT
SUT VIC AB 4-0 KS 27 (SUTURE) ×6 IMPLANT
TOWEL OR 17X24 6PK STRL BLUE (TOWEL DISPOSABLE) ×3 IMPLANT
TRAY FOLEY CATH SILVER 14FR (SET/KITS/TRAYS/PACK) ×3 IMPLANT

## 2015-12-10 NOTE — Anesthesia Preprocedure Evaluation (Signed)
Anesthesia Evaluation  Patient identified by MRN, date of birth, ID band Patient awake    Reviewed: Allergy & Precautions, NPO status , Patient's Chart, lab work & pertinent test results  Airway Mallampati: II  TM Distance: >3 FB Neck ROM: Full    Dental no notable dental hx.    Pulmonary neg pulmonary ROS, former smoker,    Pulmonary exam normal breath sounds clear to auscultation       Cardiovascular negative cardio ROS Normal cardiovascular exam Rhythm:Regular Rate:Normal     Neuro/Psych negative neurological ROS  negative psych ROS   GI/Hepatic negative GI ROS, Neg liver ROS,   Endo/Other  negative endocrine ROS  Renal/GU negative Renal ROS  negative genitourinary   Musculoskeletal negative musculoskeletal ROS (+)   Abdominal   Peds negative pediatric ROS (+)  Hematology negative hematology ROS (+)   Anesthesia Other Findings   Reproductive/Obstetrics (+) Pregnancy                             Anesthesia Physical Anesthesia Plan  ASA: II  Anesthesia Plan: Spinal   Post-op Pain Management:    Induction:   Airway Management Planned: Simple Face Mask and Natural Airway  Additional Equipment:   Intra-op Plan:   Post-operative Plan:   Informed Consent: I have reviewed the patients History and Physical, chart, labs and discussed the procedure including the risks, benefits and alternatives for the proposed anesthesia with the patient or authorized representative who has indicated his/her understanding and acceptance.   Dental advisory given  Plan Discussed with: CRNA  Anesthesia Plan Comments:         Anesthesia Quick Evaluation

## 2015-12-10 NOTE — Op Note (Signed)
Preoperative diagnosis: Intrauterine pregnancy at 39 weeks, previous c-section x 2, desires permanent sterility Postoperative diagnosis: Same Procedure: Repeat low transverse cesarean section without extensions, bilateral partial salpingectomy Surgeon: Lavina Hammanodd Cru Kritikos M.D. Assistant:  Baxter Flatteryina Frediani, RNFA Anesthesia: Spinal  Findings: Patient had normal gravid anatomy and delivered a viable female infant with Apgars of 9 and 9 weight pending Estimated blood loss: 800 cc Specimens: Placenta sent to labor and delivery, portions of each fallopian tube for routine pathology Complications: None  Procedure in detail: The patient was taken to the operating room and placed in the sitting position. Dr. Acey Lavarignan instilled spinal anesthesia.  She was then placed in the dorsosupine position with left tilt. Abdomen was then prepped and draped in the usual sterile fashion, and a foley catheter was inserted. The level of her anesthesia was found to be adequate. Abdomen was entered via a standard Pfannenstiel incision through her previous scar. Once the peritoneal cavity was entered the Alexis disposable self-retaining retractor was placed once adhesions were taken down, good visualization was achieved. A 4 cm transverse incision was then made in the lower uterine segment pushing the bladder inferior. Once the uterine cavity was entered the incision was extended digitally. The fetal vertex was grasped and delivered through the incision atraumatically. Mouth and nares were suctioned. The remainder of the infant then delivered atraumatically. Cord was doubly clamped and cut and the infant handed to the awaiting pediatric team. Cord blood was obtained. The placenta delivered spontaneously. Uterus was wiped dry with clean lap pad and all clots and debris were removed. Uterine incision was inspected and found to be free of extensions. Uterine incision was closed in 1 layer with running locking #1 Chromic. Tubes and ovaries  were inspected and found to be normal. The middle portion of each tube was grasped with a Babcock clamp and elevated.  A window was made in an avascular portion of the mesosalpinx.  A knuckle of tube was then ligated with 0 plain gut suture, the knuckles of tube were removed sharply, both ostia were identified on each side and the stumps were hemostatic.  Uterine incision was inspected and found to be hemostatic. Bleeding from serosal edges was controlled with electrocautery. The Alexis retractor was removed. Subfascial space was irrigated and made hemostatic with electrocautery. Peritoneum was closed with 2-0 Vicryl.  Fascia was closed in running fashion starting at both ends and meeting in the middle with 0 Vicryl. Subcutaneous tissue was then irrigated and made hemostatic with electrocautery, then closed with running 2-0 plain gut. Skin was closed with running 4-0 Vicryl subcuticular suture followed by steri-strips and a sterile dressing. Patient tolerated the procedure well and was taken to the recovery in stable condition. Counts were correct x2, she received Ancef 2 g IV at the beginning of the procedure and she had PAS hose on throughout the procedure.

## 2015-12-10 NOTE — Anesthesia Procedure Notes (Signed)
Spinal Patient location during procedure: OR Staffing Anesthesiologist: Steffon Gladu Performed by: anesthesiologist  Preanesthetic Checklist Completed: patient identified, site marked, surgical consent, pre-op evaluation, timeout performed, IV checked, risks and benefits discussed and monitors and equipment checked Spinal Block Patient position: sitting Prep: ChloraPrep Patient monitoring: heart rate, continuous pulse ox and blood pressure Approach: midline Location: L4-5 Injection technique: single-shot Needle Needle type: Sprotte  Needle gauge: 24 G Needle length: 9 cm Additional Notes Expiration date of kit checked and confirmed. Patient tolerated procedure well, without complications.     

## 2015-12-10 NOTE — Anesthesia Postprocedure Evaluation (Signed)
Anesthesia Post Note  Patient: Tonya [name]  Procedure(s) Performed: Procedure(s) (LRB): REPEAT CESAREAN SECTION WITH BILATERAL TUBAL LIGATION (Bilateral)  Patient location during evaluation: PACU Anesthesia Type: Spinal Level of consciousness: oriented and awake and alert Pain management: pain level controlled Vital Signs Assessment: post-procedure vital signs reviewed and stable Respiratory status: spontaneous breathing and respiratory function stable Cardiovascular status: blood pressure returned to baseline and stable Postop Assessment: no headache and no backache Anesthetic complications: no     Last Vitals:  Filed Vitals:   12/10/15 0930 12/10/15 0946  BP: 87/56 88/53  Pulse: 60 62  Temp: 36.4 C   Resp: 16 12    Last Pain: There were no vitals filed for this visit. Pain Goal: Patients Stated Pain Goal: 0 (12/10/15 0915)               Phillips Groutarignan, Nevaeha Finerty

## 2015-12-10 NOTE — Progress Notes (Signed)
Subjective: Postpartum Day 0: Cesarean Delivery Patient reports incisional pain and tolerating PO.    Objective: Vital signs in last 24 hours: Temp:  [95.8 F (35.4 C)-97.9 F (36.6 C)] 97.9 F (36.6 C) (07/10 1659) Pulse Rate:  [54-86] 58 (07/10 1330) Resp:  [12-20] 20 (07/10 1659) BP: (79-116)/(41-71) 88/46 mmHg (07/10 1330) SpO2:  [95 %-100 %] 98 % (07/10 1659)  Physical Exam:  General: alert and no distress Lochia: appropriate Uterine Fundus: firm Incision: healing well DVT Evaluation: No evidence of DVT seen on physical exam.  No results for input(s): HGB, HCT in the last 72 hours.  Assessment/Plan: Status post Cesarean section. Doing well postoperatively.  Continue current care.  Bovard-Stuckert, Mylena Sedberry 12/10/2015, 7:21 PM

## 2015-12-10 NOTE — Anesthesia Postprocedure Evaluation (Signed)
Anesthesia Post Note  Patient: Tonya [name]  Procedure(s) Performed: Procedure(s) (LRB): REPEAT CESAREAN SECTION WITH BILATERAL TUBAL LIGATION (Bilateral)  Patient location during evaluation: Mother Baby Anesthesia Type: Spinal Level of consciousness: awake, awake and alert, oriented and patient cooperative Pain management: pain level controlled Vital Signs Assessment: post-procedure vital signs reviewed and stable Respiratory status: spontaneous breathing, nonlabored ventilation and respiratory function stable Cardiovascular status: stable Postop Assessment: no headache, no backache, patient able to bend at knees and no signs of nausea or vomiting Anesthetic complications: no     Last Vitals:  Filed Vitals:   12/10/15 1235 12/10/15 1330  BP: 91/41 88/46  Pulse: 58 58  Temp: 36.4 C 36.6 C  Resp: 20 20    Last Pain:  Filed Vitals:   12/10/15 1332  PainSc: 4    Pain Goal: Patients Stated Pain Goal: 0 (12/10/15 0946)               Bernita Beckstrom L

## 2015-12-10 NOTE — Addendum Note (Signed)
Addendum  created 12/10/15 1335 by Yolonda KidaAlison L Alicen Donalson, CRNA   Modules edited: Clinical Notes   Clinical Notes:  File: [id number]

## 2015-12-10 NOTE — Interval H&P Note (Signed)
History and Physical Interval Note:  12/10/2015 7:04 AM  Tonya [name]  has presented today for surgery, with the diagnosis of Repeat C-Section, Sterilization  The various methods of treatment have been discussed with the patient and family. After consideration of risks, benefits and other options for treatment, the patient has consented to  Procedure(s): REPEAT CESAREAN SECTION WITH BILATERAL TUBAL LIGATION (Bilateral) as a surgical intervention .  The patient's history has been reviewed, patient examined, no change in status, stable for surgery.  I have reviewed the patient's chart and labs.  Questions were answered to the patient's satisfaction.     [name] D

## 2015-12-10 NOTE — Transfer of Care (Signed)
Immediate Anesthesia Transfer of Care Note  Patient: Tonya [name]  Procedure(s) Performed: Procedure(s): REPEAT CESAREAN SECTION WITH BILATERAL TUBAL LIGATION (Bilateral)  Patient Location: PACU  Anesthesia Type:Spinal  Level of Consciousness: awake, alert  and oriented  Airway & Oxygen Therapy: Patient Spontanous Breathing  Post-op Assessment: Report given to RN and Post -op Vital signs reviewed and stable  Post vital signs: Reviewed and stable  Last Vitals:  Filed Vitals:   12/10/15 0610  BP: 116/61  Pulse: 86  Temp: 36.5 C  Resp: 16    Last Pain: There were no vitals filed for this visit.    Patients Stated Pain Goal: 3 (12/10/15 0610)  Complications: No apparent anesthesia complications

## 2015-12-11 LAB — CBC
HCT: 29.6 % — ABNORMAL LOW (ref 36.0–46.0)
HEMOGLOBIN: 10 g/dL — AB (ref 12.0–15.0)
MCH: 30.4 pg (ref 26.0–34.0)
MCHC: 33.8 g/dL (ref 30.0–36.0)
MCV: 90 fL (ref 78.0–100.0)
PLATELETS: 174 10*3/uL (ref 150–400)
RBC: 3.29 MIL/uL — AB (ref 3.87–5.11)
RDW: 14.2 % (ref 11.5–15.5)
WBC: 10.8 10*3/uL — AB (ref 4.0–10.5)

## 2015-12-11 NOTE — Progress Notes (Signed)
Subjective: Postpartum Day #1: Cesarean Delivery Patient reports incisional pain and tolerating PO.    Objective: Vital signs in last 24 hours: Temp:  [95.8 F (35.4 C)-98.8 F (37.1 C)] 98.3 F (36.8 C) (07/11 0530) Pulse Rate:  [54-71] 63 (07/11 0530) Resp:  [12-20] 18 (07/11 0530) BP: (79-98)/(41-71) 98/48 mmHg (07/11 0530) SpO2:  [95 %-100 %] 96 % (07/11 0530)  Physical Exam:  General: alert Lochia: appropriate Uterine Fundus: firm Incision: dressing C/D/I   Recent Labs  12/11/15 0526  HGB 10.0*  HCT 29.6*    Assessment/Plan: Status post Cesarean section. Doing well postoperatively.  Continue current care, ambulate.  Valkyrie Guardiola D 12/11/2015, 8:15 AM

## 2015-12-12 MED ORDER — IBUPROFEN 600 MG PO TABS: 600.0000 mg | ORAL_TABLET | Freq: Four times a day (QID) | ORAL | Status: DC | PRN

## 2015-12-12 MED ORDER — OXYCODONE HCL 5 MG PO TABS: 5.0000 mg | ORAL_TABLET | ORAL | Status: DC | PRN

## 2015-12-12 NOTE — Progress Notes (Signed)
Subjective: Postpartum Day 1: Cesarean Delivery Patient reports tolerating PO, + flatus and no problems voiding.  Bonding well with baby-breastfeeding. Requests discharge to home today  Objective: Vital signs in last 24 hours: Temp:  [97.5 F (36.4 C)-98.4 F (36.9 C)] 97.5 F (36.4 C) (07/12 0536) Pulse Rate:  [79-84] 79 (07/12 0536) Resp:  [18-19] 18 (07/12 0536) BP: (95-112)/(48-61) 95/48 mmHg (07/12 0536)  Physical Exam:  General: alert, cooperative and no distress Lochia: appropriate Uterine Fundus: firm Incision: healing well DVT Evaluation: No evidence of DVT seen on physical exam. No significant calf/ankle edema.   Recent Labs  12/11/15 0526  HGB 10.0*  HCT 29.6*    Assessment/Plan: Status post Cesarean section. Doing well postoperatively.  Discharge home with standard precautions and return to clinic in 2-6 weeks.  Lael Wetherbee Worema Dewie Ahart 12/12/2015, 9:30 AM

## 2015-12-12 NOTE — Discharge Instructions (Signed)
Nothing in vagina for 6 weeks.  No sex, tampons, and douching.  Other instructions as in [facility] Discharge Booklet. °

## 2015-12-12 NOTE — Discharge Summary (Signed)
OB Discharge Summary     Patient Name: Tonya [name] DOB: [birth year] MRN: [id number]  Date of admission: 12/10/2015 Delivering MD: Jackelyn KnifeMEISINGER, TODD   Date of discharge: 12/12/2015  Admitting diagnosis: Repeat C-Section, Sterilization Intrauterine pregnancy: 5829w1d     Secondary diagnosis:  Active Problems:   S/P cesarean section  Additional problems: none     Discharge diagnosis: Term Pregnancy Delivered                                                                                                Post partum procedures:none  Augmentation: n/a  Complications: None  Hospital course:  Sceduled C/S   35 y.o. yo [id number] at 5429w1d was admitted to the hospital 12/10/2015 for scheduled cesarean section with the following indication:Elective Repeat and sterilization.  Membrane Rupture Time/Date: 7:50 AM ,12/10/2015   Patient delivered a Viable infant.12/10/2015  Details of operation can be found in separate operative note.  Pateint had an uncomplicated postpartum course.  She is ambulating, tolerating a regular diet, passing flatus, and urinating well. Patient is discharged home in stable condition on  12/12/2015          Physical exam  Filed Vitals:   12/11/15 0530 12/11/15 0916 12/11/15 1857 12/12/15 0536  BP: 98/48 100/50 112/61 95/48  Pulse: 63 60 84 79  Temp: 98.3 F (36.8 C) 98.5 F (36.9 C) 98.4 F (36.9 C) 97.5 F (36.4 C)  TempSrc: Oral Oral Oral Oral  Resp: 18 20 19 18   SpO2: 96% 97%     General: alert, cooperative and no distress Lochia: appropriate Uterine Fundus: firm Incision: Dressing is clean, dry, and intact DVT Evaluation: No evidence of DVT seen on physical exam. No significant calf/ankle edema. Labs: Lab Results  Component Value Date   WBC 10.8* 12/11/2015   HGB 10.0* 12/11/2015   HCT 29.6* 12/11/2015   MCV 90.0 12/11/2015   PLT 174 12/11/2015   CMP Latest Ref Rng 11/12/2015  Glucose 65 - 99 mg/dL 88  BUN 6 - 20 mg/dL 15  Creatinine 4.690.44 -  1.00 mg/dL 6.290.72  Sodium 528135 - 413145 mmol/L 134(L)  Potassium 3.5 - 5.1 mmol/L 4.0  Chloride 101 - 111 mmol/L 106  CO2 22 - 32 mmol/L 20(L)  Calcium 8.9 - 10.3 mg/dL 8.9  Total Protein 6.5 - 8.1 g/dL 6.2(L)  Total Bilirubin 0.3 - 1.2 mg/dL 0.6  Alkaline Phos 38 - 126 U/L 116  AST 15 - 41 U/L 13(L)  ALT 14 - 54 U/L 10(L)    Discharge instruction: per After Visit Summary and "Baby and Me Booklet".  After visit meds:    Medication List    STOP taking these medications        HYDROmorphone 2 MG tablet  Commonly known as:  DILAUDID      TAKE these medications        calcium carbonate 500 MG chewable tablet  Commonly known as:  TUMS - dosed in mg elemental calcium  Chew 4 tablets by mouth 3 (three) times daily as needed for indigestion or heartburn.     ibuprofen  600 MG tablet  Commonly known as:  ADVIL,MOTRIN  Take 1 tablet (600 mg total) by mouth every 6 (six) hours as needed.     oxyCODONE 5 MG immediate release tablet  Commonly known as:  Oxy IR/ROXICODONE  Take 1 tablet (5 mg total) by mouth every 4 (four) hours as needed (pain scale 4-7).     prenatal multivitamin Tabs tablet  Take 1 tablet by mouth daily at 12 noon.        Diet: routine diet  Activity: Advance as tolerated. Pelvic rest for 6 weeks.   Outpatient follow up:2 weeks and 6 weeks Follow up Appt:No future appointments. Follow up Visit:No Follow-up on file.  Postpartum contraception: Tubal Ligation  Newborn Data: Live born female  Birth Weight: 7 lb 6.7 oz (3365 g) APGAR: 9, 10  Baby Feeding: Breast Disposition:home with mother   12/12/2015 Edwinna Areola, DO

## 2015-12-17 ENCOUNTER — Emergency Department (HOSPITAL_COMMUNITY): Payer: Medicaid Other

## 2015-12-17 ENCOUNTER — Observation Stay (HOSPITAL_COMMUNITY)
Admission: EM | Admit: 2015-12-17 | Discharge: 2015-12-18 | Disposition: A | Discharge status: Home / Self Care | Payer: Medicaid Other | Attending: Obstetrics and Gynecology | Admitting: Obstetrics and Gynecology

## 2015-12-17 ENCOUNTER — Encounter (HOSPITAL_COMMUNITY): Payer: Self-pay | Admitting: Emergency Medicine

## 2015-12-17 DIAGNOSIS — I159 Secondary hypertension, unspecified: Secondary | ICD-10-CM

## 2015-12-17 DIAGNOSIS — Z98891 History of uterine scar from previous surgery: Secondary | ICD-10-CM

## 2015-12-17 DIAGNOSIS — Z880 Allergy status to penicillin: Secondary | ICD-10-CM | POA: Diagnosis not present

## 2015-12-17 DIAGNOSIS — R0602 Shortness of breath: Secondary | ICD-10-CM | POA: Diagnosis not present

## 2015-12-17 DIAGNOSIS — Z87891 Personal history of nicotine dependence: Secondary | ICD-10-CM | POA: Insufficient documentation

## 2015-12-17 DIAGNOSIS — R51 Headache: Secondary | ICD-10-CM | POA: Diagnosis present

## 2015-12-17 DIAGNOSIS — R7989 Other specified abnormal findings of blood chemistry: Secondary | ICD-10-CM | POA: Insufficient documentation

## 2015-12-17 DIAGNOSIS — R42 Dizziness and giddiness: Secondary | ICD-10-CM | POA: Insufficient documentation

## 2015-12-17 DIAGNOSIS — O165 Unspecified maternal hypertension, complicating the puerperium: Principal | ICD-10-CM | POA: Insufficient documentation

## 2015-12-17 DIAGNOSIS — R519 Headache, unspecified: Secondary | ICD-10-CM

## 2015-12-17 LAB — COMPREHENSIVE METABOLIC PANEL
ALT: 20 U/L (ref 14–54)
ANION GAP: 9 (ref 5–15)
AST: 24 U/L (ref 15–41)
Albumin: 2.9 g/dL — ABNORMAL LOW (ref 3.5–5.0)
Alkaline Phosphatase: 120 U/L (ref 38–126)
BUN: 11 mg/dL (ref 6–20)
CHLORIDE: 109 mmol/L (ref 101–111)
CO2: 20 mmol/L — ABNORMAL LOW (ref 22–32)
Calcium: 8.9 mg/dL (ref 8.9–10.3)
Creatinine, Ser: 0.77 mg/dL (ref 0.44–1.00)
Glucose, Bld: 92 mg/dL (ref 65–99)
POTASSIUM: 4.3 mmol/L (ref 3.5–5.1)
Sodium: 138 mmol/L (ref 135–145)
TOTAL PROTEIN: 6 g/dL — AB (ref 6.5–8.1)
Total Bilirubin: 0.7 mg/dL (ref 0.3–1.2)

## 2015-12-17 LAB — CBC WITH DIFFERENTIAL/PLATELET
BASOS ABS: 0 10*3/uL (ref 0.0–0.1)
Basophils Relative: 0 %
EOS PCT: 3 %
Eosinophils Absolute: 0.2 10*3/uL (ref 0.0–0.7)
HCT: 37.3 % (ref 36.0–46.0)
Hemoglobin: 12.4 g/dL (ref 12.0–15.0)
LYMPHS ABS: 1.8 10*3/uL (ref 0.7–4.0)
LYMPHS PCT: 26 %
MCH: 30 pg (ref 26.0–34.0)
MCHC: 33.2 g/dL (ref 30.0–36.0)
MCV: 90.3 fL (ref 78.0–100.0)
MONO ABS: 0.7 10*3/uL (ref 0.1–1.0)
Monocytes Relative: 10 %
NEUTROS ABS: 4.2 10*3/uL (ref 1.7–7.7)
Neutrophils Relative %: 61 %
PLATELETS: 264 10*3/uL (ref 150–400)
RBC: 4.13 MIL/uL (ref 3.87–5.11)
RDW: 13.1 % (ref 11.5–15.5)
WBC: 6.8 10*3/uL (ref 4.0–10.5)

## 2015-12-17 LAB — URINALYSIS, ROUTINE W REFLEX MICROSCOPIC
Bilirubin Urine: NEGATIVE
Glucose, UA: NEGATIVE mg/dL
KETONES UR: NEGATIVE mg/dL
Nitrite: NEGATIVE
PROTEIN: NEGATIVE mg/dL
Specific Gravity, Urine: 1.014 (ref 1.005–1.030)
pH: 7.5 (ref 5.0–8.0)

## 2015-12-17 LAB — BRAIN NATRIURETIC PEPTIDE: B NATRIURETIC PEPTIDE 5: 552.3 pg/mL — AB (ref 0.0–100.0)

## 2015-12-17 LAB — URINE MICROSCOPIC-ADD ON

## 2015-12-17 MED ORDER — PROCHLORPERAZINE EDISYLATE 5 MG/ML IJ SOLN
10.0000 mg | Freq: Once | INTRAMUSCULAR | Status: AC
  Administered 2015-12-17: 10 mg via INTRAVENOUS
  Filled 2015-12-17: qty 2

## 2015-12-17 MED ORDER — SODIUM CHLORIDE 0.9 % IV SOLN: 250.0000 mL | INTRAVENOUS | Status: DC | PRN

## 2015-12-17 MED ORDER — ONDANSETRON HCL 4 MG/2ML IJ SOLN
4.0000 mg | Freq: Once | INTRAMUSCULAR | Status: AC
  Administered 2015-12-17: 4 mg via INTRAVENOUS

## 2015-12-17 MED ORDER — KETOROLAC TROMETHAMINE 30 MG/ML IJ SOLN
30.0000 mg | Freq: Once | INTRAMUSCULAR | Status: AC
  Administered 2015-12-17: 30 mg via INTRAVENOUS
  Filled 2015-12-17: qty 1

## 2015-12-17 MED ORDER — SODIUM CHLORIDE 0.9 % IV BOLUS (SEPSIS)
1000.0000 mL | Freq: Once | INTRAVENOUS | Status: AC
  Administered 2015-12-17: 1000 mL via INTRAVENOUS

## 2015-12-17 MED ORDER — LABETALOL HCL 5 MG/ML IV SOLN: 40.0000 mg | Freq: Once | INTRAVENOUS | Status: DC | PRN

## 2015-12-17 MED ORDER — SODIUM CHLORIDE 0.9% FLUSH: 3.0000 mL | INTRAVENOUS | Status: DC | PRN

## 2015-12-17 MED ORDER — SODIUM CHLORIDE 0.9% FLUSH
3.0000 mL | Freq: Two times a day (BID) | INTRAVENOUS | Status: DC
  Administered 2015-12-17 (×2): 3 mL via INTRAVENOUS

## 2015-12-17 MED ORDER — OXYCODONE HCL 5 MG PO TABS
5.0000 mg | ORAL_TABLET | ORAL | Status: DC | PRN
  Administered 2015-12-17: 5 mg via ORAL
  Filled 2015-12-17: qty 1

## 2015-12-17 MED ORDER — DIPHENHYDRAMINE HCL 50 MG/ML IJ SOLN
25.0000 mg | Freq: Once | INTRAMUSCULAR | Status: AC
  Administered 2015-12-17: 25 mg via INTRAVENOUS
  Filled 2015-12-17: qty 1

## 2015-12-17 MED ORDER — FUROSEMIDE 40 MG PO TABS
40.0000 mg | ORAL_TABLET | Freq: Two times a day (BID) | ORAL | Status: DC
  Administered 2015-12-17: 40 mg via ORAL
  Filled 2015-12-17 (×2): qty 1

## 2015-12-17 MED ORDER — ONDANSETRON HCL 4 MG/2ML IJ SOLN
INTRAMUSCULAR | Status: AC
  Administered 2015-12-17: 4 mg via INTRAVENOUS
  Filled 2015-12-17: qty 2

## 2015-12-17 MED ORDER — NIFEDIPINE 10 MG PO CAPS
10.0000 mg | ORAL_CAPSULE | ORAL | Status: DC | PRN
  Filled 2015-12-17: qty 2

## 2015-12-17 MED ORDER — FUROSEMIDE 40 MG PO TABS
40.0000 mg | ORAL_TABLET | Freq: Three times a day (TID) | ORAL | Status: DC
  Administered 2015-12-17 – 2015-12-18 (×2): 40 mg via ORAL
  Filled 2015-12-17 (×3): qty 1

## 2015-12-17 MED ORDER — ACETAMINOPHEN 500 MG PO TABS
1000.0000 mg | ORAL_TABLET | Freq: Four times a day (QID) | ORAL | Status: DC | PRN
  Administered 2015-12-17: 1000 mg via ORAL
  Filled 2015-12-17: qty 2

## 2015-12-17 MED ORDER — HYDRALAZINE HCL 20 MG/ML IJ SOLN
10.0000 mg | Freq: Once | INTRAMUSCULAR | Status: AC
  Administered 2015-12-17: 10 mg via INTRAVENOUS
  Filled 2015-12-17: qty 1

## 2015-12-17 NOTE — Progress Notes (Signed)
Feeling a little better, was able to sleep, but headache returned when she woke up Afeb, VSS, last BP normal Did not get lasix yet, getting it now, Tylenol and oxycodone for headache

## 2015-12-17 NOTE — H&P (Signed)
Tonya [name] is an 35 y.o. female. She is s/p repeat LTCS and BTL on 7-10, discharged on 7-12, had no immediate complications from c-section and prenatal care was essentially uncomplicated.  Since being at home she has had increasing edema in her legs, recent severe headache, some SOB and some palpitations.  She went to [facility] ED today due to the headache.  There BP was up a bit, responded well to hydralazine.  Head CT normal, CXR normal, CMP and CBC normal, BNP elevated.  I requested transfer to [facility] for management here.    Pertinent Gynecological History: OB History: G4, P3013   Menstrual History: No LMP recorded.    Past Medical History  Diagnosis Date  . Heart palpitations   . Kidney stone complicating pregnancy     Past Surgical History  Procedure Laterality Date  . Cesarean section    . Dilation and curettage of uterus    . Kidney stones  May-June    to MAU 4x  . Cesarean section with bilateral tubal ligation Bilateral 12/10/2015    Procedure: REPEAT CESAREAN SECTION WITH BILATERAL TUBAL LIGATION;  Surgeon: Lavina Hamman, MD;  Location: [facility] BIRTHING SUITES;  Service: Obstetrics;  Laterality: Bilateral;    Family History  Problem Relation Age of Onset  . Alcohol abuse Neg Hx   . Arthritis Neg Hx   . Asthma Neg Hx   . Birth defects Neg Hx   . Cancer Neg Hx   . COPD Neg Hx   . Depression Neg Hx   . Diabetes Neg Hx   . Drug abuse Neg Hx   . Early death Neg Hx   . Hearing loss Neg Hx   . Heart disease Neg Hx   . Hyperlipidemia Neg Hx   . Hypertension Neg Hx   . Kidney disease Neg Hx   . Learning disabilities Neg Hx   . Mental illness Neg Hx   . Mental retardation Neg Hx   . Miscarriages / Stillbirths Neg Hx   . Stroke Neg Hx   . Vision loss Neg Hx   . Varicose Veins Neg Hx     Social History:  reports that she has quit smoking. Her smoking use included Cigarettes. She smoked 1.00 pack per day. She quit smokeless tobacco use about 12 years ago. She reports that  she does not drink alcohol or use illicit drugs.  Allergies:  Allergies  Allergen Reactions  . Penicillins Hives    Has patient had a PCN reaction causing immediate rash, facial/tongue/throat swelling, SOB or lightheadedness with hypotension: Yes Has patient had a PCN reaction causing severe rash involving mucus membranes or skin necrosis: No Has patient had a PCN reaction that required hospitalization No Has patient had a PCN reaction occurring within the last 10 years: No If all of the above answers are "NO", then may proceed with Cephalosporin use.     Prescriptions prior to admission  Medication Sig Dispense Refill Last Dose  . calcium carbonate (TUMS - DOSED IN MG ELEMENTAL CALCIUM) 500 MG chewable tablet Chew 4 tablets by mouth 3 (three) times daily as needed for indigestion or heartburn.   Past Week at Unknown time  . ibuprofen (ADVIL,MOTRIN) 600 MG tablet Take 1 tablet (600 mg total) by mouth every 6 (six) hours as needed. 60 tablet 1   . oxyCODONE (OXY IR/ROXICODONE) 5 MG immediate release tablet Take 1 tablet (5 mg total) by mouth every 4 (four) hours as needed (pain scale 4-7). 30 tablet  0   . Prenatal Vit-Fe Fumarate-FA (PRENATAL MULTIVITAMIN) TABS tablet Take 1 tablet by mouth daily at 12 noon.   More than a month at Unknown time    Review of Systems  Respiratory: Positive for shortness of breath.   Cardiovascular: Positive for palpitations.  Gastrointestinal: Negative.   Genitourinary: Negative.   Neurological: Positive for headaches.    Blood pressure 160/88, pulse 52, temperature 97.7 F (36.5 C), temperature source Oral, resp. rate 18, height 5\' 3"  (1.6 m), SpO2 100 %, not currently breastfeeding. Physical Exam  Vitals reviewed. Constitutional: She appears well-developed and well-nourished.  Cardiovascular: Normal rate, regular rhythm and normal heart sounds.   No murmur heard. Respiratory: Effort normal and breath sounds normal. She has no wheezes. She has no  rales.  GI: Soft. She exhibits no distension. There is no tenderness.  Fundus firm at U-1 Incision healing well    Results for orders placed or performed during the hospital encounter of 12/17/15 (from the past 24 hour(s))  CBC with Differential     Status: None   Collection Time: 12/17/15  9:03 AM  Result Value Ref Range   WBC 6.8 4.0 - 10.5 K/uL   RBC 4.13 3.87 - 5.11 MIL/uL   Hemoglobin 12.4 12.0 - 15.0 g/dL   HCT 53.237.3 99.236.0 - 42.646.0 %   MCV 90.3 78.0 - 100.0 fL   MCH 30.0 26.0 - 34.0 pg   MCHC 33.2 30.0 - 36.0 g/dL   RDW 83.413.1 19.611.5 - 22.215.5 %   Platelets 264 150 - 400 K/uL   Neutrophils Relative % 61 %   Neutro Abs 4.2 1.7 - 7.7 K/uL   Lymphocytes Relative 26 %   Lymphs Abs 1.8 0.7 - 4.0 K/uL   Monocytes Relative 10 %   Monocytes Absolute 0.7 0.1 - 1.0 K/uL   Eosinophils Relative 3 %   Eosinophils Absolute 0.2 0.0 - 0.7 K/uL   Basophils Relative 0 %   Basophils Absolute 0.0 0.0 - 0.1 K/uL  Comprehensive metabolic panel     Status: Abnormal   Collection Time: 12/17/15  9:03 AM  Result Value Ref Range   Sodium 138 135 - 145 mmol/L   Potassium 4.3 3.5 - 5.1 mmol/L   Chloride 109 101 - 111 mmol/L   CO2 20 (L) 22 - 32 mmol/L   Glucose, Bld 92 65 - 99 mg/dL   BUN 11 6 - 20 mg/dL   Creatinine, Ser 9.790.77 0.44 - 1.00 mg/dL   Calcium 8.9 8.9 - 89.210.3 mg/dL   Total Protein 6.0 (L) 6.5 - 8.1 g/dL   Albumin 2.9 (L) 3.5 - 5.0 g/dL   AST 24 15 - 41 U/L   ALT 20 14 - 54 U/L   Alkaline Phosphatase 120 38 - 126 U/L   Total Bilirubin 0.7 0.3 - 1.2 mg/dL   GFR calc non Af Amer >60 >60 mL/min   GFR calc Af Amer >60 >60 mL/min   Anion gap 9 5 - 15  Brain natriuretic peptide     Status: Abnormal   Collection Time: 12/17/15  9:03 AM  Result Value Ref Range   B Natriuretic Peptide 552.3 (H) 0.0 - 100.0 pg/mL  Urinalysis, Routine w reflex microscopic (not at [facility])     Status: Abnormal   Collection Time: 12/17/15  9:34 AM  Result Value Ref Range   Color, Urine YELLOW YELLOW   APPearance  CLEAR CLEAR   Specific Gravity, Urine 1.014 1.005 - 1.030   pH 7.5  5.0 - 8.0   Glucose, UA NEGATIVE NEGATIVE mg/dL   Hgb urine dipstick LARGE (A) NEGATIVE   Bilirubin Urine NEGATIVE NEGATIVE   Ketones, ur NEGATIVE NEGATIVE mg/dL   Protein, ur NEGATIVE NEGATIVE mg/dL   Nitrite NEGATIVE NEGATIVE   Leukocytes, UA TRACE (A) NEGATIVE  Urine microscopic-add on     Status: Abnormal   Collection Time: 12/17/15  9:34 AM  Result Value Ref Range   Squamous Epithelial / LPF 0-5 (A) NONE SEEN   WBC, UA 0-5 0 - 5 WBC/hpf   RBC / HPF 0-5 0 - 5 RBC/hpf   Bacteria, UA FEW (A) NONE SEEN    Dg Chest 2 View  12/17/2015  CLINICAL DATA:  Shortness of breath and chills EXAM: CHEST  2 VIEW COMPARISON:  July 02, 2014 FINDINGS: Lungs are clear. Heart is upper normal in size with pulmonary vascularity within normal limits. No adenopathy. No bone lesions. IMPRESSION: No edema or consolidation. Electronically Signed   By: Bretta Bang III M.D.   On: 12/17/2015 09:20   Ct Head Wo Contrast  12/17/2015  CLINICAL DATA:  Acute onset frontal headache.  Dizziness. EXAM: CT HEAD WITHOUT CONTRAST TECHNIQUE: Contiguous axial images were obtained from the base of the skull through the vertex without intravenous contrast. COMPARISON:  None. FINDINGS: Brain: The ventricles are normal in size and configuration. The left lateral ventricle is marginally larger than the right lateral ventricle, an anatomic variant. There is no intracranial mass hemorrhage, extra-axial fluid collection, or midline shift. Gray-white compartments are normal. There is no acute infarct evident. Vascular: There is no hyperdense vessel or vascular calcification evident. Skull: Bony calvarium appears intact. Sinuses/Orbits: Visualized paranasal sinuses are clear. No intraorbital lesions apparent. Other: Mastoid air cells are clear. IMPRESSION: Study within normal limits. Electronically Signed   By: Bretta Bang III M.D.   On: 12/17/2015 09:42     Assessment/Plan: Postpartum symptoms c/w mild CHF or preeclampsia, no proteinuria and normal labs except for elevated BNP.  Will admit, give Procardia prn elevated BP, diurese with PO lasix, Tylenol and oxycodone prn headache, monitor symptoms and BP and adjust accordingly.    Kalan Yeley D 12/17/2015, 1:08 PM

## 2015-12-17 NOTE — ED Provider Notes (Signed)
CSN: [id number]     Arrival date & time 12/17/15  4098 History   First MD Initiated Contact with Patient 12/17/15 0825     Chief Complaint  Patient presents with  . Post-op Problem  . Dizziness  . Headache  . Emesis    (Consider location/radiation/quality/duration/timing/severity/associated sxs/prior Treatment) Patient is a 35 y.o. female presenting with headaches. The history is provided by the patient.  Headache Pain location:  Frontal Quality:  Dull Radiates to:  Does not radiate Severity currently:  10/10 Severity at highest:  10/10 Onset quality:  Gradual Duration:  5 hours Timing:  Constant Progression:  Worsening Associated symptoms: dizziness   Associated symptoms: no abdominal pain, no back pain, no congestion, no cough, no diarrhea, no eye pain, no fever, no nausea, no neck pain, no sore throat and no vomiting     Past Medical History  Diagnosis Date  . Heart palpitations   . Kidney stone complicating pregnancy    Past Surgical History  Procedure Laterality Date  . Cesarean section    . Dilation and curettage of uterus    . Kidney stones  May-June    to MAU 4x  . Cesarean section with bilateral tubal ligation Bilateral 12/10/2015    Procedure: REPEAT CESAREAN SECTION WITH BILATERAL TUBAL LIGATION;  Surgeon: Lavina Hamman, MD;  Location: [facility] BIRTHING SUITES;  Service: Obstetrics;  Laterality: Bilateral;   Family History  Problem Relation Age of Onset  . Alcohol abuse Neg Hx   . Arthritis Neg Hx   . Asthma Neg Hx   . Birth defects Neg Hx   . Cancer Neg Hx   . COPD Neg Hx   . Depression Neg Hx   . Diabetes Neg Hx   . Drug abuse Neg Hx   . Early death Neg Hx   . Hearing loss Neg Hx   . Heart disease Neg Hx   . Hyperlipidemia Neg Hx   . Hypertension Neg Hx   . Kidney disease Neg Hx   . Learning disabilities Neg Hx   . Mental illness Neg Hx   . Mental retardation Neg Hx   . Miscarriages / Stillbirths Neg Hx   . Stroke Neg Hx   . Vision loss Neg Hx    . Varicose Veins Neg Hx    Social History  Substance Use Topics  . Smoking status: Former Smoker -- 1.00 packs/day    Types: Cigarettes  . Smokeless tobacco: Former Neurosurgeon    Quit date: 04/17/2003  . Alcohol Use: No   OB History    Gravida Para Term Preterm AB TAB SAB Ectopic Multiple Living   0 1     Review of Systems  Constitutional: Negative for fever and chills.  HENT: Negative for congestion and sore throat.   Eyes: Negative for pain.  Respiratory: Positive for shortness of breath. Negative for cough.   Cardiovascular: Positive for leg swelling. Negative for chest pain and palpitations.  Gastrointestinal: Negative for nausea, vomiting, abdominal pain and diarrhea.  Genitourinary: Negative for dysuria and flank pain.  Musculoskeletal: Negative for back pain and neck pain.  Skin: Negative for rash.  Allergic/Immunologic: Negative.   Neurological: Positive for dizziness and headaches. Negative for light-headedness.  Psychiatric/Behavioral: Negative for confusion.      Allergies  Penicillins  Home Medications   Prior to Admission medications   Medication Sig Start Date End Date Taking? Authorizing Provider  calcium carbonate (TUMS - DOSED IN  MG ELEMENTAL CALCIUM) 500 MG chewable tablet Chew 4 tablets by mouth 3 (three) times daily as needed for indigestion or heartburn.    Historical Provider, MD  ibuprofen (ADVIL,MOTRIN) 600 MG tablet Take 1 tablet (600 mg total) by mouth every 6 (six) hours as needed. 12/12/15   Cecilia Worema Banga, DO  oxyCODONE (OXY IR/ROXICODONE) 5 MG immediate release tablet Take 1 tablet (5 mg total) by mouth every 4 (four) hours as needed (pain scale 4-7). 12/12/15   Cecilia Delene LollWorema Banga, DO  Prenatal Vit-Fe Fumarate-FA (PRENATAL MULTIVITAMIN) TABS tablet Take 1 tablet by mouth daily at 12 noon.    Historical Provider, MD   BP 137/74 mmHg  Pulse 56  Temp(Src) 97.7 F (36.5 C) (Oral)  Resp 17  Ht 5\' 3"  (1.6 m)  SpO2 100%   Breastfeeding? No Physical Exam  Constitutional: She is oriented to person, place, and time. She appears well-developed and well-nourished. She appears distressed.  HENT:  Head: Normocephalic and atraumatic.  Eyes: Conjunctivae and EOM are normal. Pupils are equal, round, and reactive to light.  Neck: Normal range of motion. Neck supple.  Cardiovascular: Normal rate, regular rhythm and normal heart sounds.   Pulmonary/Chest: Effort normal. No respiratory distress. She has rales in the right lower field and the left lower field.  Abdominal: Soft. Bowel sounds are normal. There is no tenderness.  Musculoskeletal: Normal range of motion. She exhibits edema (plus three pitting LEs).  Neurological: She is alert and oriented to person, place, and time. She has normal strength and normal reflexes. No cranial nerve deficit or sensory deficit. GCS eye subscore is 4. GCS verbal subscore is 5. GCS motor subscore is 6.  Limited due to patients distress.   Skin: Skin is warm and dry. She is not diaphoretic.  Psychiatric: She has a normal mood and affect.    ED Course  Procedures (including critical care time) Labs Review Labs Reviewed  COMPREHENSIVE METABOLIC PANEL - Abnormal; Notable for the following:    CO2 20 (*)    Total Protein 6.0 (*)    Albumin 2.9 (*)    All other components within normal limits  BRAIN NATRIURETIC PEPTIDE - Abnormal; Notable for the following:    B Natriuretic Peptide 552.3 (*)    All other components within normal limits  URINALYSIS, ROUTINE W REFLEX MICROSCOPIC (NOT AT [facility]) - Abnormal; Notable for the following:    Hgb urine dipstick LARGE (*)    Leukocytes, UA TRACE (*)    All other components within normal limits  URINE MICROSCOPIC-ADD ON - Abnormal; Notable for the following:    Squamous Epithelial / LPF 0-5 (*)    Bacteria, UA FEW (*)    All other components within normal limits  CBC WITH DIFFERENTIAL/PLATELET    Imaging Review Dg Chest 2 View  12/17/2015   CLINICAL DATA:  Shortness of breath and chills EXAM: CHEST  2 VIEW COMPARISON:  July 02, 2014 FINDINGS: Lungs are clear. Heart is upper normal in size with pulmonary vascularity within normal limits. No adenopathy. No bone lesions. IMPRESSION: No edema or consolidation. Electronically Signed   By: Bretta BangWilliam  Woodruff III M.D.   On: 12/17/2015 09:20   Ct Head Wo Contrast  12/17/2015  CLINICAL DATA:  Acute onset frontal headache.  Dizziness. EXAM: CT HEAD WITHOUT CONTRAST TECHNIQUE: Contiguous axial images were obtained from the base of the skull through the vertex without intravenous contrast. COMPARISON:  None. FINDINGS: Brain: The ventricles are normal in size and configuration. The  left lateral ventricle is marginally larger than the right lateral ventricle, an anatomic variant. There is no intracranial mass hemorrhage, extra-axial fluid collection, or midline shift. Gray-white compartments are normal. There is no acute infarct evident. Vascular: There is no hyperdense vessel or vascular calcification evident. Skull: Bony calvarium appears intact. Sinuses/Orbits: Visualized paranasal sinuses are clear. No intraorbital lesions apparent. Other: Mastoid air cells are clear. IMPRESSION: Study within normal limits. Electronically Signed   By: Bretta Bang III M.D.   On: 12/17/2015 09:42   I have personally reviewed and evaluated these images and lab results as part of my medical decision-making.   EKG Interpretation   Date/Time:  Monday December 17 2015 08:22:30 EDT Ventricular Rate:  50 PR Interval:  154 QRS Duration: 82 QT Interval:  488 QTC Calculation: 444 R Axis:   93 Text Interpretation:  Unusual P axis, possible ectopic atrial bradycardia  Rightward axis Abnormal ECG No old tracing to compare Confirmed by  Ethelda Chick  MD, SAM [phone]) on 12/17/2015 8:33:56 AM      MDM   Final diagnoses:  Secondary hypertension, unspecified  Headache, unspecified headache type  Shortness of breath   Elevated brain natriuretic peptide (BNP) level    The patient is a 35 year old female presenting to the emergency department due to headache that began this morning. Reports having C-section 1 week ago uncomplicated.  Denies history of preeclampsia in the past. Denies history of hypertension. Reports awakening this morning with a throbbing frontal headache associated with nausea vomiting and photophobia. No previous similar headaches in the past. Also reports increasing lower extremity swelling over the last week with no right upper quadrant pain. Admits to increased shortness of breath when laying flat and with exertion but no frank chest pain.  On evaluation the patient is hemodynamically stable but appears in distress. Unable to fully comply with neurologic exam to due to her severe headache. Found to be hypertensive however new onset from previous. +3 bilateral pitting edema of lower extremities.  CBC and CMP unremarkable. CT head performed new onset headache without signs of intracranial hemorrhage or abnormalities otherwise. Migraine cocktail given without alleviation. Toradol given with some alleviation. Concern for possible preeclampsia and UA ordered but displayed no protein. Crackles with shortness of breath and BNP ordered which was moderately elevated. Low suspicion for PE at this time.   Discussed with Dr. Jackelyn Knife who agrees with care and recommends BP control and transfer to [facility].  Pt reevaluated and given hydralazine.  Pt stable for transfer.   Medical decision making overseen by Dr. Sherri Sear.         Tery Sanfilippo, MD 12/17/15 1200  Doug Sou, MD 12/17/15 [phone]

## 2015-12-17 NOTE — ED Provider Notes (Signed)
840 amComplains of frontal headache throbbing in nature, he by nausea and photophobia upon awakening 5:30 AM today. Patient also reports leg swelling bilaterally for the past 5 days. On exam she is alert and awake Glasgow Coma Score 15 HEENT exam no facial asymmetry eyes pupils 2-3 mm equal and reactive to light extraocular muscles intact neck supple trachea midline heart regular rate and rhythm mildly bradycardic abdomen nondistended bilateral lower extremities with 2+ pretibial pitting edema skin warm and dry neurologic Glasgow Coma Score 15 cranial nerves II through XII intact reflexes 2+ and symmetric at knee jerk ankle jerk and biceps toes downward going bilaterally. Consistent  10:45 AM headache is somewhat improved after treatment with intravenous Toradol, Compazine and Benadryl.  Concern for preeclampsia given peripheral edema hypertension and recent delivery.Dr Jackelyn KnifeMeisinger consulted and [facility]. [facility]. Should be administered hydralazine prior to transfer. CRITICAL CARE Performed by: Doug SouJACUBOWITZ,Marlaina Coburn Total critical care time: 30 minutes Critical care time was exclusive of separately billable procedures and treating other patients. Critical care was necessary to treat or prevent imminent or life-threatening deterioration. Critical care was time spent personally by me on the following activities: development of treatment plan with patient and/or surrogate as well as nursing, discussions with consultants, evaluation of patient's response to treatment, examination of patient, obtaining history from patient or surrogate, ordering and performing treatments and interventions, ordering and review of laboratory studies, ordering and review of radiographic studies, pulse oximetry and re-evaluation of patient's condition.       Doug SouSam Fantasy Donald, MD 12/17/15 1059

## 2015-12-17 NOTE — ED Notes (Signed)
Pt c/o woke up with headache and dizziness. Pt took ibuprofen and oxycodone but vomited shortly after.

## 2015-12-18 LAB — COMPREHENSIVE METABOLIC PANEL
ALT: 20 U/L (ref 14–54)
ANION GAP: 9 (ref 5–15)
AST: 17 U/L (ref 15–41)
Albumin: 3.1 g/dL — ABNORMAL LOW (ref 3.5–5.0)
Alkaline Phosphatase: 121 U/L (ref 38–126)
BILIRUBIN TOTAL: 0.6 mg/dL (ref 0.3–1.2)
BUN: 11 mg/dL (ref 6–20)
CO2: 22 mmol/L (ref 22–32)
Calcium: 8.3 mg/dL — ABNORMAL LOW (ref 8.9–10.3)
Chloride: 108 mmol/L (ref 101–111)
Creatinine, Ser: 0.87 mg/dL (ref 0.44–1.00)
GFR calc Af Amer: >60 mL/min (ref >60)
Glucose, Bld: 93 mg/dL (ref 65–99)
POTASSIUM: 3.4 mmol/L — AB (ref 3.5–5.1)
Sodium: 139 mmol/L (ref 135–145)
TOTAL PROTEIN: 6.8 g/dL (ref 6.5–8.1)

## 2015-12-18 LAB — CBC
HEMATOCRIT: 38.4 % (ref 36.0–46.0)
HEMOGLOBIN: 13 g/dL (ref 12.0–15.0)
MCH: 29.5 pg (ref 26.0–34.0)
MCHC: 33.9 g/dL (ref 30.0–36.0)
MCV: 87.3 fL (ref 78.0–100.0)
Platelets: 312 10*3/uL (ref 150–400)
RBC: 4.4 MIL/uL (ref 3.87–5.11)
RDW: 13.2 % (ref 11.5–15.5)
WBC: 7.5 10*3/uL (ref 4.0–10.5)

## 2015-12-18 MED ORDER — OXYCODONE HCL 5 MG PO TABS: 5.0000 mg | ORAL_TABLET | ORAL | Status: DC | PRN

## 2015-12-18 NOTE — Progress Notes (Signed)
Patient ID: Tonya [name], female   DOB: [birth year], 35 y.o.   MRN: [id number] Pt doing well. Reports no longer has SOB and HA resolved. Feels well- misses her baby. No new complaints VSS; BP 110s/70s ABD- soft,nt, ND EXT - no edema  Labs - stable this am  A/P: HD#2 s/p admission for postpartum headache and SOB- sx improved with lasix, pain meds         Will discharge to home with rx for oxycodone          Keep f/u appt in office on 7/21 for incision check as well         Call or return sooner if symptoms resume         Routine pp instructions given

## 2015-12-18 NOTE — Discharge Instructions (Signed)
Nothing in vagina for 6 weeks.  No sex, tampons, and douching.  Other [facility]. Call or return to office/hospital if symptoms resume

## 2015-12-18 NOTE — Progress Notes (Signed)
Discharge teaching complete. Pt understood all instructions and did not have any questions. Pt ambulated out of the hospital and discharged home. 

## 2015-12-18 NOTE — Discharge Summary (Signed)
Physician Discharge Summary  Patient ID: Tonya NeptuneShannon M Morefield MRN: [id number] DOB/AGE: March 09, 1981 35 y.o.  Admit date: 12/17/2015 Discharge date: 12/18/2015  Admission Diagnoses:  Discharge Diagnoses:  Active Problems:   Postpartum hypertension   Discharged Condition: stable  Hospital Course: Pt was admitted a week post c/s with complaint of severe headache, SOb and mildly elevated BP. A workup had also noted an elevated BNP. Pt received a dose of hydralazine for BP and remained stable throughout hospitalization with BPs in 110s/70s. She aslo received lasix and had diuresed well overnight - she reports noted improvement of edema in lower extremities and no further SOB this am. Pt considered stable for discharge to home today. Has postpartum incision check already scheduled for 12/21/15 - will also follow up on admission complaints then as well. Instructions reviewed with pt  Consults: None  Significant Diagnostic Studies: labs: stable, radiology: CXR: normal and CT scan: nl and cardiac graphics: ECG: nl  Treatments: IV hydration, analgesia: oxycodone and ibuprofen and lasix for edema ( le adn possible cardiac)  Discharge Exam: Blood pressure 113/67, pulse 57, temperature 99.1 F (37.3 C), temperature source Oral, resp. rate 16, height 5\' 3"  (1.6 m), weight 166 lb 0.1 oz (75.299 kg), SpO2 97 %, not currently breastfeeding. General appearance: alert, cooperative and no distress Eyes: negative GI: soft, non-tender; bowel sounds normal; no masses,  no organomegaly Extremities: edema none and Homans sign is negative, no sign of DVT Pulses: 2+ and symmetric Skin: Skin color, texture, turgor normal. No rashes or lesions Neurologic: Grossly normal Incision/Wound:c/d/i  Disposition: 01-Home or Self Care  Discharge Instructions    Activity as tolerated    Complete by:  As directed      Call MD for:  difficulty breathing, headache or visual disturbances    Complete by:  As directed      Call  MD for:  persistant dizziness or light-headedness    Complete by:  As directed      Call MD for:  persistant nausea and vomiting    Complete by:  As directed      Call MD for:  redness, tenderness, or signs of infection (pain, swelling, redness, odor or green/yellow discharge around incision site)    Complete by:  As directed      Call MD for:  severe uncontrolled pain    Complete by:  As directed      Call MD for:  temperature >100.4    Complete by:  As directed      Diet - low sodium heart healthy    Complete by:  As directed      Discharge instructions    Complete by:  As directed   Nothing in vagina for 6 weeks.  No sex, tampons, and douching.  Other instructions as in [facility].     Driving restriction     Complete by:  As directed   Avoid driving while taking narcotic pain medication     Lifting restrictions    Complete by:  As directed   Weight restriction of 15 lbs.     Sexual acrtivity    Complete by:  As directed   Nothing in vagina for 6 weeks.  No sex, tampons, and douching.            Medication List    TAKE these medications        ibuprofen 600 MG tablet  Commonly known as:  ADVIL,MOTRIN  Take 1 tablet (600 mg total)  by mouth every 6 (six) hours as needed.     oxyCODONE 5 MG immediate release tablet  Commonly known as:  Oxy IR/ROXICODONE  Take 1 tablet (5 mg total) by mouth every 4 (four) hours as needed for severe pain (or headache unrelieved by tylenol).           Follow-up Information    Follow up with MEISINGER,TODD D, MD. Go on 12/21/2015.   Specialty:  Obstetrics and Gynecology   Why:  For wound re-check adn follow up after hospitalization   Contact information:   [address], [address] [place] [address] [phone]       Signed: Edwinna Areola 12/18/2015, 9:43 AM

## 2015-12-26 ENCOUNTER — Other Ambulatory Visit: Payer: Self-pay | Admitting: Obstetrics and Gynecology

## 2015-12-26 DIAGNOSIS — N6320 Unspecified lump in the left breast, unspecified quadrant: Secondary | ICD-10-CM

## 2016-01-02 ENCOUNTER — Other Ambulatory Visit: Payer: Medicaid Other

## 2016-01-09 ENCOUNTER — Ambulatory Visit
Admission: RE | Admit: 2016-01-09 | Discharge: 2016-01-09 | Disposition: A | Discharge status: Home / Self Care | Payer: Medicaid Other | Source: Ambulatory Visit | Attending: Obstetrics and Gynecology | Admitting: Obstetrics and Gynecology

## 2016-01-09 DIAGNOSIS — N6320 Unspecified lump in the left breast, unspecified quadrant: Secondary | ICD-10-CM

## 2016-04-02 ENCOUNTER — Encounter (HOSPITAL_COMMUNITY): Payer: Self-pay | Admitting: Emergency Medicine

## 2016-04-02 ENCOUNTER — Ambulatory Visit (HOSPITAL_COMMUNITY)
Admission: EM | Admit: 2016-04-02 | Discharge: 2016-04-02 | Disposition: A | Discharge status: Home / Self Care | Payer: Medicaid Other | Attending: Emergency Medicine | Admitting: Emergency Medicine

## 2016-04-02 DIAGNOSIS — M546 Pain in thoracic spine: Secondary | ICD-10-CM | POA: Diagnosis not present

## 2016-04-02 MED ORDER — CYCLOBENZAPRINE HCL 10 MG PO TABS: 10.0000 mg | ORAL_TABLET | Freq: Three times a day (TID) | ORAL | 0 refills | Status: DC | PRN

## 2016-04-02 MED ORDER — KETOROLAC TROMETHAMINE 60 MG/2ML IM SOLN
60.0000 mg | Freq: Once | INTRAMUSCULAR | Status: AC
  Administered 2016-04-02: 60 mg via INTRAMUSCULAR

## 2016-04-02 MED ORDER — KETOROLAC TROMETHAMINE 60 MG/2ML IM SOLN
INTRAMUSCULAR | Status: AC
  Filled 2016-04-02: qty 2

## 2016-04-02 MED ORDER — NAPROXEN 500 MG PO TABS: 500.0000 mg | ORAL_TABLET | Freq: Two times a day (BID) | ORAL | 0 refills | Status: DC | PRN

## 2016-04-02 NOTE — Discharge Instructions (Signed)
You were given a shot of Toradol for pain today. Start Naproxen twice a day as needed for pain and inflammation. You may take Flexeril 10mg  one half to one whole tablet 3 times a day as needed for muscle spasms. Apply warm moist heat to back for comfort. Follow-up with your primary care provider in 3 days if not improving or go to ER if pain worsens.

## 2016-04-02 NOTE — ED Triage Notes (Signed)
[facility] a [facility] pain that started 3 days ago. The patient reported that the pain was initially in her lower back and she treated it with a heating pad. The pain has now moved up to her mid back and has increased in intensity. The patient denied any known injury but did state that she has started a new job that requires more sitting and computer time.

## 2016-04-02 NOTE — ED Provider Notes (Signed)
CSN: [id number]     Arrival date & time 04/02/16  1002 History   First MD Initiated Contact with Patient 04/02/16 1017     Chief Complaint  Patient presents with  . Back Pain   (Consider location/radiation/quality/duration/timing/severity/associated sxs/prior Treatment) 35 year old female presents with back pain that started 2-3 days ago. Pain started bilaterally in lower back and now has settled in mid-thoracic region. Pain level is now 8/10. No distinct injury but she has started a new job where she works at home and is sitting at the computer. She feels she may have hunched over too long and strained her back. She has tried a heating pad and Ibuprofen with no relief. Takes no other daily medication. No previous back problems.       Past Medical History:  Diagnosis Date  . Heart palpitations   . Kidney stone complicating pregnancy    Past Surgical History:  Procedure Laterality Date  . CESAREAN SECTION    . CESAREAN SECTION WITH BILATERAL TUBAL LIGATION Bilateral 12/10/2015   Procedure: REPEAT CESAREAN SECTION WITH BILATERAL TUBAL LIGATION;  Surgeon: Lavina Hammanodd Meisinger, MD;  [facility];  Service: Obstetrics;  Laterality: Bilateral;  . DILATION AND CURETTAGE OF UTERUS    . kidney stones  May-June   to MAU 4x   Family History  Problem Relation Age of Onset  . Alcohol abuse Neg Hx   . Arthritis Neg Hx   . Asthma Neg Hx   . Birth defects Neg Hx   . Cancer Neg Hx   . COPD Neg Hx   . Depression Neg Hx   . Diabetes Neg Hx   . Drug abuse Neg Hx   . Early death Neg Hx   . Hearing loss Neg Hx   . Heart disease Neg Hx   . Hyperlipidemia Neg Hx   . Hypertension Neg Hx   . Kidney disease Neg Hx   . Learning disabilities Neg Hx   . Mental illness Neg Hx   . Mental retardation Neg Hx   . Miscarriages / Stillbirths Neg Hx   . Stroke Neg Hx   . Vision loss Neg Hx   . Varicose Veins Neg Hx    Social History  Substance Use Topics  . Smoking status: Former Smoker     Packs/day: 1.00    Types: Cigarettes  . Smokeless tobacco: Former NeurosurgeonUser    Quit date: 04/17/2003  . Alcohol use No   OB History    Gravida Para Term Preterm AB Living   4 3 3   1 1    SAB TAB Ectopic Multiple Live Births   1     0 1     Review of Systems  Constitutional: Negative for chills, fatigue, fever and unexpected weight change.  Respiratory: Negative for cough, chest tightness, shortness of breath and wheezing.   Cardiovascular: Negative for chest pain.  Gastrointestinal: Negative for abdominal pain, diarrhea, nausea and vomiting.  Genitourinary: Negative for difficulty urinating, dysuria and vaginal discharge.  Musculoskeletal: Positive for back pain. Negative for arthralgias, joint swelling, neck pain and neck stiffness.  Neurological: Negative for dizziness, tremors, weakness, light-headedness, numbness and headaches.  Hematological: Negative for adenopathy.    Allergies  Penicillins  Home Medications   Prior to Admission medications   Medication Sig Start Date End Date Taking? Authorizing Provider  cyclobenzaprine (FLEXERIL) 10 MG tablet Take 1 tablet (10 mg total) by mouth 3 (three) times daily as needed for muscle spasms. 04/02/16  Sudie GrumblingAnn Berry Jennise Both, NP  naproxen (NAPROSYN) 500 MG tablet Take 1 tablet (500 mg total) by mouth 2 (two) times daily as needed for moderate pain. 04/02/16   Sudie GrumblingAnn Berry Kavita Bartl, NP   Meds Ordered and Administered this Visit   Medications  ketorolac (TORADOL) injection 60 mg (60 mg Intramuscular Given 04/02/16 1037)    BP 113/75 (BP Location: Right Arm)   Pulse 82   Temp 97.7 F (36.5 C) (Oral)   Resp 18   LMP 03/30/2016 (Exact Date)   SpO2 99%   Breastfeeding? No  No data found.   Physical Exam  Constitutional: She is oriented to person, place, and time. She appears well-developed and well-nourished.  She is changing positions slowly due to pain and appears uncomfortable sitting in exam room.  HENT:  Head: Normocephalic and  atraumatic.  Neck: Normal range of motion. Neck supple.  Cardiovascular: Normal rate, regular rhythm and normal heart sounds.   Pulmonary/Chest: Effort normal and breath sounds normal. She has no wheezes. She has no rales. She exhibits no tenderness.  Abdominal: Soft. She exhibits no mass. There is no tenderness. There is no guarding.  Musculoskeletal: She exhibits tenderness.       Thoracic back: She exhibits decreased range of motion, tenderness, pain and spasm. She exhibits no swelling, no edema and no deformity.  Has decreased range of motion particularly with flexion and extension. Tender to palpation mid-thoracic area of back along midline. Spasm present on right side. Slightly tender bilaterally lower lumbar area. SLR negative. No neuro deficits noted.   Lymphadenopathy:    She has no cervical adenopathy.  Neurological: She is alert and oriented to person, place, and time. She has normal strength. No cranial nerve deficit or sensory deficit.  Skin: Skin is warm and dry. Capillary refill takes less than 2 seconds.  Psychiatric: She has a normal mood and affect. Her behavior is normal. Judgment and thought content normal.    Urgent Care Course   Clinical Course    Procedures (including critical care time)  Labs Review Labs Reviewed - No data to display  Imaging Review No results found.   Visual Acuity Review  Right Eye Distance:   Left Eye Distance:   Bilateral Distance:    Right Eye Near:   Left Eye Near:    Bilateral Near:         MDM   1. Acute midline thoracic back pain    Gave Toradol 60mg  IM today for pain. Start Naproxen twice a day as needed for pain and inflammation. Take Flexeril 10mg  one half to one whole tablet 3 times a day as needed for muscle spasms. Apply warm moist heat to back for comfort. Follow-up with her primary care provider in 3 days if not improving or go to ER if pain worsens.      Sudie GrumblingAnn Berry Garik Diamant, NP 04/03/16 [phone]

## 2016-04-28 ENCOUNTER — Encounter (HOSPITAL_COMMUNITY): Payer: Self-pay

## 2016-04-28 ENCOUNTER — Inpatient Hospital Stay (HOSPITAL_COMMUNITY)
Admission: EM | Admit: 2016-04-28 | Discharge: 2016-05-01 | DRG: 694 | Disposition: A | Discharge status: Home / Self Care | Payer: Medicaid Other | Attending: Internal Medicine | Admitting: Internal Medicine

## 2016-04-28 ENCOUNTER — Inpatient Hospital Stay (HOSPITAL_COMMUNITY): Payer: Medicaid Other

## 2016-04-28 DIAGNOSIS — Z87442 Personal history of urinary calculi: Secondary | ICD-10-CM

## 2016-04-28 DIAGNOSIS — N202 Calculus of kidney with calculus of ureter: Principal | ICD-10-CM | POA: Diagnosis present

## 2016-04-28 DIAGNOSIS — R630 Anorexia: Secondary | ICD-10-CM | POA: Diagnosis present

## 2016-04-28 DIAGNOSIS — Z87891 Personal history of nicotine dependence: Secondary | ICD-10-CM

## 2016-04-28 DIAGNOSIS — Z880 Allergy status to penicillin: Secondary | ICD-10-CM

## 2016-04-28 DIAGNOSIS — N12 Tubulo-interstitial nephritis, not specified as acute or chronic: Secondary | ICD-10-CM | POA: Diagnosis present

## 2016-04-28 DIAGNOSIS — R110 Nausea: Secondary | ICD-10-CM | POA: Diagnosis not present

## 2016-04-28 LAB — BASIC METABOLIC PANEL
ANION GAP: 9 (ref 5–15)
BUN: 8 mg/dL (ref 6–20)
CALCIUM: 7.9 mg/dL — AB (ref 8.9–10.3)
CO2: 17 mmol/L — ABNORMAL LOW (ref 22–32)
CREATININE: 0.84 mg/dL (ref 0.44–1.00)
Chloride: 108 mmol/L (ref 101–111)
GLUCOSE: 122 mg/dL — AB (ref 65–99)
Potassium: 3.2 mmol/L — ABNORMAL LOW (ref 3.5–5.1)
Sodium: 134 mmol/L — ABNORMAL LOW (ref 135–145)

## 2016-04-28 LAB — CBC WITH DIFFERENTIAL/PLATELET
BASOS ABS: 0 10*3/uL (ref 0.0–0.1)
BASOS PCT: 0 %
EOS ABS: 0.1 10*3/uL (ref 0.0–0.7)
Eosinophils Relative: 1 %
HEMATOCRIT: 34.7 % — AB (ref 36.0–46.0)
Hemoglobin: 12.1 g/dL (ref 12.0–15.0)
Lymphocytes Relative: 15 %
Lymphs Abs: 1 10*3/uL (ref 0.7–4.0)
MCH: 30.5 pg (ref 26.0–34.0)
MCHC: 34.9 g/dL (ref 30.0–36.0)
MCV: 87.4 fL (ref 78.0–100.0)
MONO ABS: 0.7 10*3/uL (ref 0.1–1.0)
Monocytes Relative: 11 %
NEUTROS ABS: 4.7 10*3/uL (ref 1.7–7.7)
NEUTROS PCT: 73 %
Platelets: 147 10*3/uL — ABNORMAL LOW (ref 150–400)
RBC: 3.97 MIL/uL (ref 3.87–5.11)
RDW: 12.6 % (ref 11.5–15.5)
WBC: 6.4 10*3/uL (ref 4.0–10.5)

## 2016-04-28 LAB — URINALYSIS, ROUTINE W REFLEX MICROSCOPIC
Glucose, UA: NEGATIVE mg/dL
KETONES UR: 15 mg/dL — AB
NITRITE: NEGATIVE
PROTEIN: 100 mg/dL — AB
Specific Gravity, Urine: 1.019 (ref 1.005–1.030)
pH: 5.5 (ref 5.0–8.0)

## 2016-04-28 LAB — URINE MICROSCOPIC-ADD ON

## 2016-04-28 LAB — POC URINE PREG, ED: Preg Test, Ur: NEGATIVE

## 2016-04-28 MED ORDER — SODIUM CHLORIDE 0.9 % IV SOLN
INTRAVENOUS | Status: DC
  Administered 2016-04-29 – 2016-05-01 (×4): via INTRAVENOUS

## 2016-04-28 MED ORDER — ENOXAPARIN SODIUM 40 MG/0.4ML ~~LOC~~ SOLN
40.0000 mg | Freq: Every day | SUBCUTANEOUS | Status: DC
  Administered 2016-04-29 – 2016-05-01 (×3): 40 mg via SUBCUTANEOUS
  Filled 2016-04-28 (×3): qty 0.4

## 2016-04-28 MED ORDER — CEFTRIAXONE SODIUM 1 G IJ SOLR
1.0000 g | INTRAMUSCULAR | Status: DC
  Administered 2016-04-29 – 2016-04-30 (×2): 1 g via INTRAVENOUS
  Filled 2016-04-28 (×3): qty 10

## 2016-04-28 MED ORDER — ACETAMINOPHEN 325 MG PO TABS
650.0000 mg | ORAL_TABLET | Freq: Four times a day (QID) | ORAL | Status: DC | PRN
  Administered 2016-04-30: 650 mg via ORAL
  Filled 2016-04-28: qty 2

## 2016-04-28 MED ORDER — DEXTROSE 5 % IV SOLN
1.0000 g | Freq: Once | INTRAVENOUS | Status: AC
  Administered 2016-04-28: 1 g via INTRAVENOUS
  Filled 2016-04-28: qty 10

## 2016-04-28 MED ORDER — ONDANSETRON HCL 4 MG/2ML IJ SOLN
4.0000 mg | Freq: Four times a day (QID) | INTRAMUSCULAR | Status: DC | PRN
  Administered 2016-04-29 – 2016-04-30 (×3): 4 mg via INTRAVENOUS
  Filled 2016-04-28 (×3): qty 2

## 2016-04-28 MED ORDER — SODIUM CHLORIDE 0.9 % IV BOLUS (SEPSIS)
1000.0000 mL | Freq: Once | INTRAVENOUS | Status: AC
  Administered 2016-04-28: 1000 mL via INTRAVENOUS

## 2016-04-28 MED ORDER — KETOROLAC TROMETHAMINE 30 MG/ML IJ SOLN
30.0000 mg | Freq: Four times a day (QID) | INTRAMUSCULAR | Status: DC | PRN
  Administered 2016-04-29 – 2016-04-30 (×5): 30 mg via INTRAVENOUS
  Filled 2016-04-28 (×5): qty 1

## 2016-04-28 MED ORDER — KETOROLAC TROMETHAMINE 30 MG/ML IJ SOLN
10.0000 mg | Freq: Once | INTRAMUSCULAR | Status: AC
  Administered 2016-04-28: 9.9 mg via INTRAVENOUS
  Filled 2016-04-28: qty 1

## 2016-04-28 MED ORDER — FENTANYL CITRATE (PF) 100 MCG/2ML IJ SOLN
50.0000 ug | Freq: Once | INTRAMUSCULAR | Status: AC
  Administered 2016-04-28: 50 ug via INTRAVENOUS
  Filled 2016-04-28: qty 2

## 2016-04-28 NOTE — H&P (Signed)
History and Physical    Parks NeptuneShannon M Tyminski WUJ:[id number] DOB: 1980-06-24 DOA: 04/28/2016   PCP: Temple PaciniBONSU, OSEI A, DO Chief Complaint:  Chief Complaint  Patient presents with  . Flank Pain  . Dysuria    HPI: Parks NeptuneShannon M Zabriskie is a 35 y.o. female with medical history significant of kidney stones in past.  Patient presents to the ED with c/o severe flank pain, anorexia and nausea, dysuria, fever.  Pain on both left and R flank, R side is worse.  Pain is severe.  Nothing makes pain better or worse.  Feels like prior kidney stones she says.  Pain onset about 1 week ago, persistent since onset.  Over past week has completed 2 courses of ABx as outpatient.  ED Course: UA shows UTI, mod HGB in urine.  Review of Systems: As per HPI otherwise 10 point review of systems negative.    Past Medical History:  Diagnosis Date  . Heart palpitations   . Kidney stone complicating pregnancy     Past Surgical History:  Procedure Laterality Date  . CESAREAN SECTION    . CESAREAN SECTION WITH BILATERAL TUBAL LIGATION Bilateral 12/10/2015   Procedure: REPEAT CESAREAN SECTION WITH BILATERAL TUBAL LIGATION;  Surgeon: Lavina Hammanodd Meisinger, MD;  [facility];  Service: Obstetrics;  Laterality: Bilateral;  . DILATION AND CURETTAGE OF UTERUS    . kidney stones  May-June   to MAU 4x     reports that she has quit smoking. Her smoking use included Cigarettes. She smoked 1.00 pack per day. She quit smokeless tobacco use about 13 years ago. She reports that she does not drink alcohol or use drugs.  Allergies  Allergen Reactions  . Penicillins Hives    Has patient had a PCN reaction causing immediate rash, facial/tongue/throat swelling, SOB or lightheadedness with hypotension: Yes Has patient had a PCN reaction causing severe rash involving mucus membranes or skin necrosis: No Has patient had a PCN reaction that required hospitalization No Has patient had a PCN reaction occurring within the last 10 years:  No If all of the above answers are "NO", then may proceed with Cephalosporin use.     Family History  Problem Relation Age of Onset  . Alcohol abuse Neg Hx   . Arthritis Neg Hx   . Asthma Neg Hx   . Birth defects Neg Hx   . Cancer Neg Hx   . COPD Neg Hx   . Depression Neg Hx   . Diabetes Neg Hx   . Drug abuse Neg Hx   . Early death Neg Hx   . Hearing loss Neg Hx   . Heart disease Neg Hx   . Hyperlipidemia Neg Hx   . Hypertension Neg Hx   . Kidney disease Neg Hx   . Learning disabilities Neg Hx   . Mental illness Neg Hx   . Mental retardation Neg Hx   . Miscarriages / Stillbirths Neg Hx   . Stroke Neg Hx   . Vision loss Neg Hx   . Varicose Veins Neg Hx       Prior to Admission medications   Medication Sig Start Date End Date Taking? Authorizing Provider  cyclobenzaprine (FLEXERIL) 10 MG tablet Take 1 tablet (10 mg total) by mouth 3 (three) times daily as needed for muscle spasms. 04/02/16  Yes Sudie GrumblingAnn Berry Amyot, NP  ibuprofen (ADVIL,MOTRIN) 600 MG tablet Take 600 mg by mouth every 6 (six) hours as needed for mild pain.   Yes Historical  Provider, MD  naproxen (NAPROSYN) 500 MG tablet Take 1 tablet (500 mg total) by mouth 2 (two) times daily as needed for moderate pain. 04/02/16  Yes Sudie GrumblingAnn Berry Amyot, NP    Physical Exam: Vitals:   04/28/16 1945 04/28/16 2115 04/28/16 2324  BP: 97/77  103/62  Pulse: 110  82  Resp: 22  16  Temp: 100.3 F (37.9 C)    TempSrc: Oral    SpO2: 100%  99%  Weight:  65.8 kg (145 lb)   Height:  5\' 3"  (1.6 m)       Constitutional: NAD, calm, comfortable Eyes: PERRL, lids and conjunctivae normal ENMT: Mucous membranes are moist. Posterior pharynx clear of any exudate or lesions.Normal dentition.  Neck: normal, supple, no masses, no thyromegaly Respiratory: clear to auscultation bilaterally, no wheezing, no crackles. Normal respiratory effort. No accessory muscle use.  Cardiovascular: Regular rate and rhythm, no murmurs / rubs / gallops. No  extremity edema. 2+ pedal pulses. No carotid bruits.  Abdomen: no tenderness, no masses palpated. No hepatosplenomegaly. Bowel sounds positive.  Musculoskeletal: no clubbing / cyanosis. No joint deformity upper and lower extremities. Good ROM, no contractures. Normal muscle tone.  Skin: no rashes, lesions, ulcers. No induration Neurologic: CN 2-12 grossly intact. Sensation intact, DTR normal. Strength 5/5 in all 4.  Psychiatric: Normal judgment and insight. Alert and oriented x 3. Normal mood.    Labs on Admission: I have personally reviewed following labs and imaging studies  CBC:  Recent Labs Lab 04/28/16 2301  WBC 6.4  NEUTROABS 4.7  HGB 12.1  HCT 34.7*  MCV 87.4  PLT 147*   Basic Metabolic Panel:  Recent Labs Lab 04/28/16 2301  NA 134*  K 3.2*  CL 108  CO2 17*  GLUCOSE 122*  BUN 8  CREATININE 0.84  CALCIUM 7.9*   GFR: Estimated Creatinine Clearance: 86.9 mL/min (by C-G formula based on SCr of 0.84 mg/dL). Liver Function Tests: No results for input(s): AST, ALT, ALKPHOS, BILITOT, PROT, ALBUMIN in the last 168 hours. No results for input(s): LIPASE, AMYLASE in the last 168 hours. No results for input(s): AMMONIA in the last 168 hours. Coagulation Profile: No results for input(s): INR, PROTIME in the last 168 hours. Cardiac Enzymes: No results for input(s): CKTOTAL, CKMB, CKMBINDEX, TROPONINI in the last 168 hours. BNP (last 3 results) No results for input(s): PROBNP in the last 8760 hours. HbA1C: No results for input(s): HGBA1C in the last 72 hours. CBG: No results for input(s): GLUCAP in the last 168 hours. Lipid Profile: No results for input(s): CHOL, HDL, LDLCALC, TRIG, CHOLHDL, LDLDIRECT in the last 72 hours. Thyroid Function Tests: No results for input(s): TSH, T4TOTAL, FREET4, T3FREE, THYROIDAB in the last 72 hours. Anemia Panel: No results for input(s): VITAMINB12, FOLATE, FERRITIN, TIBC, IRON, RETICCTPCT in the last 72 hours. Urine analysis:      Component Value Date/Time   COLORURINE AMBER (A) 04/28/2016 2015   APPEARANCEUR TURBID (A) 04/28/2016 2015   LABSPEC 1.019 04/28/2016 2015   PHURINE 5.5 04/28/2016 2015   GLUCOSEU NEGATIVE 04/28/2016 2015   HGBUR MODERATE (A) 04/28/2016 2015   BILIRUBINUR SMALL (A) 04/28/2016 2015   KETONESUR 15 (A) 04/28/2016 2015   PROTEINUR 100 (A) 04/28/2016 2015   UROBILINOGEN 1.0 02/04/2015 1917   NITRITE NEGATIVE 04/28/2016 2015   LEUKOCYTESUR LARGE (A) 04/28/2016 2015   Sepsis Labs: @LABRCNTIP (procalcitonin:4,lacticidven:4) )No results found for this or any previous visit (from the past 240 hour(s)).   Radiological Exams on Admission: No results  found.  EKG: Independently reviewed.  Assessment/Plan Principal Problem:   Pyelonephritis Active Problems:   Nausea with vomiting    1. Pyelonephritis - 1. Rocephin 2. Cultures pending 3. Checking CT stone protocol to r/o pyohydronephrosis (needs urology consult if obstructing stone / hydronephrosis is seen). 4. Tylenol PRN fever 5. toradol PRN pain 2. Nausea - zofran PRN   DVT prophylaxis: Lovenox Code Status: Full Family Communication: No family in room Consults called: None Admission status: Admit to inpatient   Hillary Bow DO [facility] Pager [phone] from 7PM-7AM  If 7AM-7PM, please contact the day physician for the patient [email or web address] Password [email or web address]  04/28/2016, 11:45 PM

## 2016-04-28 NOTE — ED Provider Notes (Signed)
[facility]-EMERGENCY DEPT Provider Note   CSN: [id number] Arrival date & time: 04/28/16  1921     History   Chief Complaint Chief Complaint  Patient presents with  . Flank Pain  . Dysuria    HPI Tonya [name] is a 35 y.o. female.  HPI  Patient presents with concern of flank pain. Pain seems to began about one week ago, since onset has been persistent, and both her right and left flank. Pain is sore, nonradiating, intermittently severe. There is associated dysuria, with darkening of the urine. No vomiting, though there is anorexia. No abdominal pain. Patient notices history of kidney stones in the past, states that this feels similar. No medication taken for pain relief.  Over the past week she has completed a course of 2 antibiotics, last Flagyl   Past Medical History:  Diagnosis Date  . Heart palpitations   . Kidney stone complicating pregnancy     Patient Active Problem List   Diagnosis Date Noted  . Postpartum hypertension 12/17/2015  . S/P cesarean section 12/10/2015  . Pregnancy with nephrolithiasis in third trimester 11/12/2015  . Nephrolithiasis 11/01/2015  . Dysuria 10/24/2015  . Pregnancy with generalized abdominal pain, antepartum 10/24/2015  . Nausea with vomiting 10/24/2015    Past Surgical History:  Procedure Laterality Date  . CESAREAN SECTION    . CESAREAN SECTION WITH BILATERAL TUBAL LIGATION Bilateral 12/10/2015   Procedure: REPEAT CESAREAN SECTION WITH BILATERAL TUBAL LIGATION;  Surgeon: Lavina Hamman, MD;  Location: [facility] BIRTHING SUITES;  Service: Obstetrics;  Laterality: Bilateral;  . DILATION AND CURETTAGE OF UTERUS    . kidney stones  May-June   to MAU 4x    OB History    Gravida Para Term Preterm AB Living   4 3 3   1 1    SAB TAB Ectopic Multiple Live Births   1     0 1       Home Medications    Prior to Admission medications   Medication Sig Start Date End Date Taking? Authorizing Provider  cyclobenzaprine (FLEXERIL) 10 MG  tablet Take 1 tablet (10 mg total) by mouth 3 (three) times daily as needed for muscle spasms. 04/02/16   Sudie Grumbling, NP  naproxen (NAPROSYN) 500 MG tablet Take 1 tablet (500 mg total) by mouth 2 (two) times daily as needed for moderate pain. 04/02/16   Sudie Grumbling, NP    Family History Family History  Problem Relation Age of Onset  . Alcohol abuse Neg Hx   . Arthritis Neg Hx   . Asthma Neg Hx   . Birth defects Neg Hx   . Cancer Neg Hx   . COPD Neg Hx   . Depression Neg Hx   . Diabetes Neg Hx   . Drug abuse Neg Hx   . Early death Neg Hx   . Hearing loss Neg Hx   . Heart disease Neg Hx   . Hyperlipidemia Neg Hx   . Hypertension Neg Hx   . Kidney disease Neg Hx   . Learning disabilities Neg Hx   . Mental illness Neg Hx   . Mental retardation Neg Hx   . Miscarriages / Stillbirths Neg Hx   . Stroke Neg Hx   . Vision loss Neg Hx   . Varicose Veins Neg Hx     Social History Social History  Substance Use Topics  . Smoking status: Former Smoker    Packs/day: 1.00    Types: Cigarettes  .  Smokeless tobacco: Former NeurosurgeonUser    Quit date: 04/17/2003  . Alcohol use No     Allergies   Penicillins   Review of Systems Review of Systems  Constitutional:       Per HPI, otherwise negative  HENT:       Per HPI, otherwise negative  Respiratory:       Per HPI, otherwise negative  Cardiovascular:       Per HPI, otherwise negative  Gastrointestinal: Negative for vomiting.  Endocrine:       Negative aside from HPI  Genitourinary:       Neg aside from HPI   Musculoskeletal:       Per HPI, otherwise negative  Skin: Negative.   Neurological: Negative for syncope.     Physical Exam Updated Vital Signs BP 97/77   Pulse 110   Temp 100.3 F (37.9 C) (Oral)   Resp 22   Ht 5\' 3"  (1.6 m)   Wt 145 lb (65.8 kg)   LMP 04/23/2016   SpO2 100%   BMI 25.69 kg/m   Physical Exam  Constitutional: She is oriented to person, place, and time. She appears well-developed and  well-nourished. No distress.  HENT:  Head: Normocephalic and atraumatic.  Eyes: Conjunctivae and EOM are normal.  Cardiovascular: Regular rhythm.   Tachycardic  Pulmonary/Chest: Effort normal and breath sounds normal. No stridor. No respiratory distress.  Abdominal: She exhibits no distension. There is no tenderness.  Mild tenderness to palpation, both right and left flank.  Musculoskeletal: She exhibits no edema.  Neurological: She is alert and oriented to person, place, and time. No cranial nerve deficit.  Skin: Skin is warm and dry.  Psychiatric: She has a normal mood and affect.  Nursing note and vitals reviewed.    ED Treatments / Results  Labs (all labs ordered are listed, but only abnormal results are displayed) Labs Reviewed  URINALYSIS, ROUTINE W REFLEX MICROSCOPIC (NOT AT [facility]) - Abnormal; Notable for the following:       Result Value   Color, Urine AMBER (*)    APPearance TURBID (*)    Hgb urine dipstick MODERATE (*)    Bilirubin Urine SMALL (*)    Ketones, ur 15 (*)    Protein, ur 100 (*)    Leukocytes, UA LARGE (*)    All other components within normal limits  URINE MICROSCOPIC-ADD ON - Abnormal; Notable for the following:    Squamous Epithelial / LPF 0-5 (*)    Bacteria, UA MANY (*)    All other components within normal limits  POC URINE PREG, ED     Procedures Procedures (including critical care time)  Medications Ordered in ED Medications  ketorolac (TORADOL) 30 MG/ML injection 9.9 mg (not administered)  sodium chloride 0.9 % bolus 1,000 mL (not administered)  cefTRIAXone (ROCEPHIN) 1 g in dextrose 5 % 50 mL IVPB (not administered)     Initial Impression / Assessment and Plan / ED Course  I have reviewed the triage vital signs and the nursing notes.  Pertinent labs & imaging results that were available during my care of the patient were reviewed by me and considered in my medical decision making (see chart for details).  Clinical Course     Chart review notable for ultrasound 2 within the past 6 months, both negative for kidney stone.  11:09 PM Patient remains tachycardic, blood pressure 97/77. She remains uncomfortable appearing.   Final Clinical Impressions(s) / ED Diagnoses  Patient presents with concern  of bilateral flank pain, is found to have fever ongoing flank pain and if there is concern for pyelonephritis. With persistent borderline low blood pressure, low-grade fever, completion of antibiotics with no improvement of her pain, the patient was started on IV ceftriaxone, received multiple analgesics, was admitted for further evaluation and management.   Gerhard Munchobert Lark Runk, MD 04/28/16 [phone]

## 2016-04-28 NOTE — ED Triage Notes (Signed)
Pt complaining of bilateral flank pain and dysuria. Pt states nausea/vomiting x 3 days. Pt complaining of fevers and chills. Pt states hx of kidney stones.

## 2016-04-28 NOTE — ED Notes (Signed)
Patient transported to CT scan . 

## 2016-04-29 LAB — LACTIC ACID, PLASMA
LACTIC ACID, VENOUS: 0.5 mmol/L (ref 0.5–1.9)
Lactic Acid, Venous: 0.5 mmol/L (ref 0.5–1.9)

## 2016-04-29 LAB — BASIC METABOLIC PANEL
ANION GAP: 6 (ref 5–15)
BUN: 6 mg/dL (ref 6–20)
CALCIUM: 8 mg/dL — AB (ref 8.9–10.3)
CO2: 20 mmol/L — AB (ref 22–32)
CREATININE: 0.85 mg/dL (ref 0.44–1.00)
Chloride: 111 mmol/L (ref 101–111)
Glucose, Bld: 103 mg/dL — ABNORMAL HIGH (ref 65–99)
Potassium: 3.3 mmol/L — ABNORMAL LOW (ref 3.5–5.1)
SODIUM: 137 mmol/L (ref 135–145)

## 2016-04-29 LAB — CBC
HEMATOCRIT: 34.6 % — AB (ref 36.0–46.0)
Hemoglobin: 11.9 g/dL — ABNORMAL LOW (ref 12.0–15.0)
MCH: 30.1 pg (ref 26.0–34.0)
MCHC: 34.4 g/dL (ref 30.0–36.0)
MCV: 87.6 fL (ref 78.0–100.0)
PLATELETS: 164 10*3/uL (ref 150–400)
RBC: 3.95 MIL/uL (ref 3.87–5.11)
RDW: 12.5 % (ref 11.5–15.5)
WBC: 6.5 10*3/uL (ref 4.0–10.5)

## 2016-04-29 MED ORDER — MORPHINE SULFATE (PF) 2 MG/ML IV SOLN
2.0000 mg | Freq: Once | INTRAVENOUS | Status: AC
  Administered 2016-04-29: 2 mg via INTRAVENOUS
  Filled 2016-04-29: qty 1

## 2016-04-29 MED ORDER — SODIUM CHLORIDE 0.9 % IV SOLN
INTRAVENOUS | Status: AC
  Administered 2016-04-29: 01:00:00 via INTRAVENOUS

## 2016-04-29 MED ORDER — ONDANSETRON HCL 4 MG/2ML IJ SOLN: 4.0000 mg | Freq: Three times a day (TID) | INTRAMUSCULAR | Status: AC | PRN

## 2016-04-29 MED ORDER — OXYCODONE-ACETAMINOPHEN 5-325 MG PO TABS
1.0000 | ORAL_TABLET | Freq: Four times a day (QID) | ORAL | Status: DC | PRN
  Administered 2016-04-29 – 2016-05-01 (×8): 2 via ORAL
  Filled 2016-04-29 (×8): qty 2

## 2016-04-29 MED ORDER — POTASSIUM CHLORIDE CRYS ER 20 MEQ PO TBCR
40.0000 meq | EXTENDED_RELEASE_TABLET | Freq: Once | ORAL | Status: AC
  Administered 2016-04-29: 40 meq via ORAL
  Filled 2016-04-29: qty 2

## 2016-04-29 MED ORDER — DOCUSATE SODIUM 100 MG PO CAPS
100.0000 mg | ORAL_CAPSULE | Freq: Two times a day (BID) | ORAL | Status: DC
  Administered 2016-04-29 – 2016-05-01 (×4): 100 mg via ORAL
  Filled 2016-04-29 (×4): qty 1

## 2016-04-29 NOTE — Progress Notes (Signed)
Received patient from ED wheeled by CNA. Patient AOx4, ambulatory, skin intact, VS stable with soft BP, O2Sat at 100% on RA and pain at 6/10.  CNA gave patient crackers, chicken broth and ginger ale.  Administered PRN pain medication Toral IV per order. Will monitor.

## 2016-04-29 NOTE — Progress Notes (Signed)
PROGRESS NOTE    Tonya [name]  MVH:[id number]  DOB: 03/08/1981  DOA: 04/28/2016 PCP: Temple PaciniBONSU, OSEI A, DO  Hospital course: HPI: Tonya [name] is a 35 y.o. female with medical history significant of kidney stones in past.  Patient presents to the ED with c/o severe flank pain, anorexia and nausea, dysuria, fever.  Pain on both left and R flank, R side is worse.  Pain is severe.  Nothing makes pain better or worse.  Feels like prior kidney stones she says.  Pain onset about 1 week ago, persistent since onset.  Over past week has completed 2 courses of ABx as outpatient.  ED Course: UA shows UTI, mod HGB in urine.  Assessment & Plan:   1. Pyelonephritis - 1. Rocephin 2. Cultures pending 3. CT stone protocol has r/o pyohydronephrosis 4. Tylenol PRN fever, percocet added 11/28 5. toradol PRN pain  2. Nausea - zofran PRN 3. Recurrent nephrolithiasis - CT stone study reveals 2 left non-obstructing stones, continue symptomatic treatment  DVT prophylaxis: enoxaparin Code Status: full  Subjective: Pt reports stone has not yet passed, pain not well controlled  Objective: Vitals:   04/28/16 2324 04/29/16 0017 04/29/16 0055 04/29/16 0559  BP: 103/62 98/55 (!) 94/51 (!) 101/57  Pulse: 82 84 68 70  Resp: 16 16 16 16   Temp:   98.9 F (37.2 C) 99.5 F (37.5 C)  TempSrc:   Oral Oral  SpO2: 99% 99% 100% 100%  Weight:   66.2 kg (146 lb)   Height:   5\' 3"  (1.6 m)     Intake/Output Summary (Last 24 hours) at 04/29/16 1038 Last data filed at 04/29/16 0900  Gross per 24 hour  Intake          2115.42 ml  Output                0 ml  Net          2115.42 ml   Filed Weights   04/28/16 2115 04/29/16 0055  Weight: 65.8 kg (145 lb) 66.2 kg (146 lb)    Exam:  General exam: awake, alert, no distress cooperative Respiratory system: Clear. No increased work of breathing. Cardiovascular system: S1 & S2 heard, RRR. No JVD, murmurs, gallops, clicks or pedal edema. Gastrointestinal  system: Abdomen is nondistended, soft and nontender. Normal bowel sounds heard. Central nervous system: Alert and oriented. No focal neurological deficits. Extremities: no CCE.  Data Reviewed: Basic Metabolic Panel:  Recent Labs Lab 04/28/16 2301  NA 134*  K 3.2*  CL 108  CO2 17*  GLUCOSE 122*  BUN 8  CREATININE 0.84  CALCIUM 7.9*   Liver Function Tests: No results for input(s): AST, ALT, ALKPHOS, BILITOT, PROT, ALBUMIN in the last 168 hours. No results for input(s): LIPASE, AMYLASE in the last 168 hours. No results for input(s): AMMONIA in the last 168 hours. CBC:  Recent Labs Lab 04/28/16 2301  WBC 6.4  NEUTROABS 4.7  HGB 12.1  HCT 34.7*  MCV 87.4  PLT 147*   Cardiac Enzymes: No results for input(s): CKTOTAL, CKMB, CKMBINDEX, TROPONINI in the last 168 hours. CBG (last 3)  No results for input(s): GLUCAP in the last 72 hours. No results found for this or any previous visit (from the past 240 hour(s)).   Studies: Ct Renal Stone Study  Result Date: 04/29/2016 CLINICAL DATA:  Severe flank pain with anorexia nausea dysuria and fever EXAM: CT ABDOMEN AND PELVIS WITHOUT CONTRAST TECHNIQUE: Multidetector CT imaging of the  abdomen and pelvis was performed following the standard protocol without IV contrast. COMPARISON:  11/01/2015 FINDINGS: Lower chest: Lung bases demonstrate mild dependent atelectasis. No acute consolidation or pleural effusion. Hepatobiliary: No focal liver abnormality is seen. No gallstones, gallbladder wall thickening, or biliary dilatation. Pancreas: Unremarkable. No pancreatic ductal dilatation or surrounding inflammatory changes. Spleen: Normal in size without focal abnormality. Adrenals/Urinary Tract: Adrenal glands are within normal limits. Mild to moderate left perinephric fat stranding. No significant left hydronephrosis. Minimally prominent left ureter. Multiple punctate stones within the lower poles of the kidneys. There is a 5 mm stone at the left  UVJ. Suspected additional punctate stone in the distal left ureter slightly proximal to the left UVJ. No definitive right ureteral calculi are visualized. There is minimal right perinephric edema. Stomach/Bowel: Stomach is within normal limits. Appendix appears normal. No evidence of bowel wall thickening, distention, or inflammatory changes. Vascular/Lymphatic: No significant vascular findings are present. No enlarged abdominal or pelvic lymph nodes. Reproductive: Uterus and bilateral adnexa are unremarkable. Other: No abdominal wall hernia or abnormality. No abdominopelvic ascites. Musculoskeletal: No acute or significant osseous findings. IMPRESSION: 1. 5 mm stone at the left UVJ with suspected additional punctate stone in the distal left ureter just proximal to the left UVJ. Slight prominence of left ureter but no significant left hydronephrosis. 2. There are multiple additional punctate stones within both kidneys. There is no right hydronephrosis, hydroureter, or ureteral calculus on the right. 3. There is bilateral left greater than right perinephric fat stranding, findings could relate to a pyelonephritis. Electronically Signed   By: Jasmine PangKim  Fujinaga M.D.   On: 04/29/2016 00:15   Scheduled Meds: . sodium chloride   Intravenous STAT  . cefTRIAXone (ROCEPHIN)  IV  1 g Intravenous Q24H  . enoxaparin (LOVENOX) injection  40 mg Subcutaneous Daily  . potassium chloride  40 mEq Oral Once   Continuous Infusions: . sodium chloride      Principal Problem:   Pyelonephritis Active Problems:   Nausea  Time spent:   Standley Dakinslanford Isatu Macinnes, MD, FAAFP [facility] Pager [phone]  If 7PM-7AM, please contact night-coverage [email or web address] Password [email or web address] 04/29/2016, 10:38 AM    LOS: 1 day

## 2016-04-30 DIAGNOSIS — N12 Tubulo-interstitial nephritis, not specified as acute or chronic: Secondary | ICD-10-CM

## 2016-04-30 LAB — CBC
HCT: 30.8 % — ABNORMAL LOW (ref 36.0–46.0)
Hemoglobin: 10.3 g/dL — ABNORMAL LOW (ref 12.0–15.0)
MCH: 29.9 pg (ref 26.0–34.0)
MCHC: 33.4 g/dL (ref 30.0–36.0)
MCV: 89.3 fL (ref 78.0–100.0)
PLATELETS: 158 10*3/uL (ref 150–400)
RBC: 3.45 MIL/uL — AB (ref 3.87–5.11)
RDW: 12.7 % (ref 11.5–15.5)
WBC: 4.7 10*3/uL (ref 4.0–10.5)

## 2016-04-30 LAB — BASIC METABOLIC PANEL
Anion gap: 8 (ref 5–15)
CALCIUM: 7.7 mg/dL — AB (ref 8.9–10.3)
CO2: 20 mmol/L — ABNORMAL LOW (ref 22–32)
CREATININE: 0.84 mg/dL (ref 0.44–1.00)
Chloride: 110 mmol/L (ref 101–111)
GFR calc Af Amer: >60 mL/min (ref >60)
Glucose, Bld: 85 mg/dL (ref 65–99)
Potassium: 3.5 mmol/L (ref 3.5–5.1)
SODIUM: 138 mmol/L (ref 135–145)

## 2016-04-30 LAB — MAGNESIUM: MAGNESIUM: 1.8 mg/dL (ref 1.7–2.4)

## 2016-04-30 NOTE — Progress Notes (Signed)
PROGRESS NOTE  Tonya NeptuneShannon M Craney [name] DOB: [birth year] DOA: 04/28/2016 PCP: Julio SicksBONSU, OSEI A, DO   LOS: 2 days   Brief Narrative: 35 y.o. female with medical history significant of kidney stones in past.  Patient presents to the ED with c/o severe flank pain, anorexia and nausea, dysuria, fever.  Pain on both left and R flank, R side is worse.  Pain is severe.  Nothing makes pain better or worse.   Assessment & Plan: Principal Problem:   Pyelonephritis Active Problems:   Nausea   UTI / Pyelonephritis - place on Ceftriaxone on admission, continue  - urine cultures pending - CT scan with left and right (L>R) perinephric fat stranding concerning for pyelo - afebrile today, WBC normal   Nephrolithiasis - CT renal stone with 5 mm stone at the left UVJ with suspected additional punctate stone in the distal left ureter just proximal to the left UVJ. Slight prominence of left ureter but no significant left hydronephrosis. - d/w Dr. McDiarmid, she is afebrile and without leukocytosis currently, there is high chance stone will pass spontaneously, if she becomes febrile or developed WBC will formally consult Urology.   DVT prophylaxis: Lovenox Code Status: Full Family Communication: no family bedside Disposition Plan: home when ready  Consultants:   None   Procedures:   None   Antimicrobials:  Ceftriaxone 11/28 >>   Subjective: - continues to complain of nausea and bilateral flank pain left more than right   Objective: Vitals:   04/29/16 0559 04/29/16 1359 04/29/16 2150 04/30/16 0543  BP: (!) 101/57 (!) 93/52 124/64 113/72  Pulse: 70 60 71 72  Resp: 16 16 16 17   Temp: 99.5 F (37.5 C) 98.9 F (37.2 C) 97.8 F (36.6 C) 98.7 F (37.1 C)  TempSrc: Oral Oral Oral Oral  SpO2: 100% 100%  99%  Weight:      Height:        Intake/Output Summary (Last 24 hours) at 04/30/16 1413 Last data filed at 04/30/16 1125  Gross per 24 hour  Intake             1540 ml  Output               500 ml  Net             1040 ml   Filed Weights   04/28/16 2115 04/29/16 0055  Weight: 65.8 kg (145 lb) 66.2 kg (146 lb)    Examination: Constitutional: NAD Vitals:   04/29/16 0559 04/29/16 1359 04/29/16 2150 04/30/16 0543  BP: (!) 101/57 (!) 93/52 124/64 113/72  Pulse: 70 60 71 72  Resp: 16 16 16 17   Temp: 99.5 F (37.5 C) 98.9 F (37.2 C) 97.8 F (36.6 C) 98.7 F (37.1 C)  TempSrc: Oral Oral Oral Oral  SpO2: 100% 100%  99%  Weight:      Height:       Eyes: PERRL, lids and conjunctivae normal Respiratory: clear to auscultation bilaterally, no wheezing, no crackles.  Cardiovascular: Regular rate and rhythm, no murmurs / rubs / gallops.   Abdomen: no tenderness. Bowel sounds positive. + CVA tenderness bilaterally  Musculoskeletal: no clubbing / cyanosis Skin: no rashes, lesions, ulcers. No induration Neurologic: non focal  Psychiatric: Normal judgment and insight. Alert and oriented x 3. Normal mood.    Data Reviewed: I have personally reviewed following labs and imaging studies  CBC:  Recent Labs Lab 04/28/16 2301 04/29/16 1048 04/30/16 0602  WBC 6.4 6.5 4.7  NEUTROABS 4.7  --   --   HGB 12.1 11.9* 10.3*  HCT 34.7* 34.6* 30.8*  MCV 87.4 87.6 89.3  PLT 147* 164 158   Basic Metabolic Panel:  Recent Labs Lab 04/28/16 2301 04/29/16 1048 04/30/16 0602  NA 134* 137 138  K 3.2* 3.3* 3.5  CL 108 111 110  CO2 17* 20* 20*  GLUCOSE 122* 103* 85  BUN 8 6 <5*  CREATININE 0.84 0.85 0.84  CALCIUM 7.9* 8.0* 7.7*  MG  --   --  1.8   Urine analysis:    Component Value Date/Time   COLORURINE AMBER (A) 04/28/2016 2015   APPEARANCEUR TURBID (A) 04/28/2016 2015   LABSPEC 1.019 04/28/2016 2015   PHURINE 5.5 04/28/2016 2015   GLUCOSEU NEGATIVE 04/28/2016 2015   HGBUR MODERATE (A) 04/28/2016 2015   BILIRUBINUR SMALL (A) 04/28/2016 2015   KETONESUR 15 (A) 04/28/2016 2015   PROTEINUR 100 (A) 04/28/2016 2015   UROBILINOGEN 1.0 02/04/2015 1917    NITRITE NEGATIVE 04/28/2016 2015   LEUKOCYTESUR LARGE (A) 04/28/2016 2015   Sepsis Labs: Invalid input(s): PROCALCITONIN, LACTICIDVEN  No results found for this or any previous visit (from the past 240 hour(s)).    Radiology Studies: Ct Renal Stone Study  Result Date: 04/29/2016 CLINICAL DATA:  Severe flank pain with anorexia nausea dysuria and fever EXAM: CT ABDOMEN AND PELVIS WITHOUT CONTRAST TECHNIQUE: Multidetector CT imaging of the abdomen and pelvis was performed following the standard protocol without IV contrast. COMPARISON:  11/01/2015 FINDINGS: Lower chest: Lung bases demonstrate mild dependent atelectasis. No acute consolidation or pleural effusion. Hepatobiliary: No focal liver abnormality is seen. No gallstones, gallbladder wall thickening, or biliary dilatation. Pancreas: Unremarkable. No pancreatic ductal dilatation or surrounding inflammatory changes. Spleen: Normal in size without focal abnormality. Adrenals/Urinary Tract: Adrenal glands are within normal limits. Mild to moderate left perinephric fat stranding. No significant left hydronephrosis. Minimally prominent left ureter. Multiple punctate stones within the lower poles of the kidneys. There is a 5 mm stone at the left UVJ. Suspected additional punctate stone in the distal left ureter slightly proximal to the left UVJ. No definitive right ureteral calculi are visualized. There is minimal right perinephric edema. Stomach/Bowel: Stomach is within normal limits. Appendix appears normal. No evidence of bowel wall thickening, distention, or inflammatory changes. Vascular/Lymphatic: No significant vascular findings are present. No enlarged abdominal or pelvic lymph nodes. Reproductive: Uterus and bilateral adnexa are unremarkable. Other: No abdominal wall hernia or abnormality. No abdominopelvic ascites. Musculoskeletal: No acute or significant osseous findings. IMPRESSION: 1. 5 mm stone at the left UVJ with suspected additional  punctate stone in the distal left ureter just proximal to the left UVJ. Slight prominence of left ureter but no significant left hydronephrosis. 2. There are multiple additional punctate stones within both kidneys. There is no right hydronephrosis, hydroureter, or ureteral calculus on the right. 3. There is bilateral left greater than right perinephric fat stranding, findings could relate to a pyelonephritis. Electronically Signed   By: Jasmine PangKim  Fujinaga M.D.   On: 04/29/2016 00:15     Scheduled Meds: . cefTRIAXone (ROCEPHIN)  IV  1 g Intravenous Q24H  . docusate sodium  100 mg Oral BID  . enoxaparin (LOVENOX) injection  40 mg Subcutaneous Daily   Continuous Infusions: . sodium chloride 125 mL/hr at 04/30/16 1143    Pamella Pertostin Sharisse Rantz, MD, PhD [facility] Pager [phone]  If 7PM-7AM, please contact night-coverage [email or web address] Password [email or web address] 04/30/2016, 2:13 PM

## 2016-05-01 LAB — CBC
HCT: 33 % — ABNORMAL LOW (ref 36.0–46.0)
Hemoglobin: 10.9 g/dL — ABNORMAL LOW (ref 12.0–15.0)
MCH: 29.5 pg (ref 26.0–34.0)
MCHC: 33 g/dL (ref 30.0–36.0)
MCV: 89.2 fL (ref 78.0–100.0)
PLATELETS: 172 10*3/uL (ref 150–400)
RBC: 3.7 MIL/uL — AB (ref 3.87–5.11)
RDW: 12.6 % (ref 11.5–15.5)
WBC: 3 10*3/uL — AB (ref 4.0–10.5)

## 2016-05-01 LAB — BASIC METABOLIC PANEL
Anion gap: 8 (ref 5–15)
CALCIUM: 8 mg/dL — AB (ref 8.9–10.3)
CO2: 19 mmol/L — ABNORMAL LOW (ref 22–32)
CREATININE: 0.73 mg/dL (ref 0.44–1.00)
Chloride: 113 mmol/L — ABNORMAL HIGH (ref 101–111)
Glucose, Bld: 89 mg/dL (ref 65–99)
Potassium: 3.7 mmol/L (ref 3.5–5.1)
SODIUM: 140 mmol/L (ref 135–145)

## 2016-05-01 MED ORDER — ONDANSETRON 4 MG PO TBDP: 4.0000 mg | ORAL_TABLET | Freq: Three times a day (TID) | ORAL | 0 refills | Status: DC | PRN

## 2016-05-01 MED ORDER — OXYCODONE-ACETAMINOPHEN 5-325 MG PO TABS: 1.0000 | ORAL_TABLET | Freq: Four times a day (QID) | ORAL | 0 refills | Status: DC | PRN

## 2016-05-01 MED ORDER — CIPROFLOXACIN HCL 500 MG PO TABS: 500.0000 mg | ORAL_TABLET | Freq: Two times a day (BID) | ORAL | 0 refills | Status: DC

## 2016-05-01 NOTE — Progress Notes (Signed)
Discharge instructions reviewed with pt and prescriptions given.  Pt verbalized understanding and had no questions.  Pt discharged in stable condition via wheelchair with family.  Denim Start Lindsay   

## 2016-05-01 NOTE — Discharge Instructions (Signed)
Follow with BONSU, OSEI A, DO in 1-2 weeks  Please get a complete blood count and chemistry panel checked by your Primary MD at your next visit, and again as instructed by your Primary MD. Please get your medications reviewed and adjusted by your Primary MD.  Please request your Primary MD to go over all Hospital Tests and Procedure/Radiological results at the follow up, please get all Hospital records sent to your Prim MD by signing hospital release before you go home.  If you had Pneumonia of Lung problems at the Hospital: Please get a 2 view Chest X ray done in 6-8 weeks after hospital discharge or sooner if instructed by your Primary MD.  If you have Congestive Heart Failure: Please call your Cardiologist or Primary MD anytime you have any of the following symptoms:  1) 3 pound weight gain in 24 hours or 5 pounds in 1 week  2) shortness of breath, with or without a dry hacking cough  3) swelling in the hands, feet or stomach  4) if you have to sleep on extra pillows at night in order to breathe  Follow cardiac low salt diet and 1.5 lit/day fluid restriction.  If you have diabetes Accuchecks 4 times/day, Once in AM empty stomach and then before each meal. Log in all results and show them to your primary doctor at your next visit. If any glucose reading is under 80 or above 300 call your primary MD immediately.  If you have Seizure/Convulsions/Epilepsy: Please do not drive, operate heavy machinery, participate in activities at heights or participate in high speed sports until you have seen by Primary MD or a Neurologist and advised to do so again.  If you had Gastrointestinal Bleeding: Please ask your Primary MD to check a complete blood count within one week of discharge or at your next visit. Your endoscopic/colonoscopic biopsies that are pending at the time of discharge, will also need to followed by your Primary MD.  Get Medicines reviewed and adjusted. Please take all your  medications with you for your next visit with your Primary MD  Please request your Primary MD to go over all hospital tests and procedure/radiological results at the follow up, please ask your Primary MD to get all Hospital records sent to his/her office.  If you experience worsening of your admission symptoms, develop shortness of breath, life threatening emergency, suicidal or homicidal thoughts you must seek medical attention immediately by calling 911 or calling your MD immediately  if symptoms less severe.  You must read complete instructions/literature along with all the possible adverse reactions/side effects for all the Medicines you take and that have been prescribed to you. Take any new Medicines after you have completely understood and accpet all the possible adverse reactions/side effects.   Do not drive or operate heavy machinery when taking Pain medications.   Do not take more than prescribed Pain, Sleep and Anxiety Medications  Special Instructions: If you have smoked or chewed Tobacco  in the last 2 yrs please stop smoking, stop any regular Alcohol  and or any Recreational drug use.  Wear Seat belts while driving.  Please note You were cared for by a hospitalist during your hospital stay. If you have any questions about your discharge medications or the care you received while you were in the hospital after you are discharged, you can call the unit and asked to speak with the hospitalist on call if the hospitalist that took care of you is not available.  Once you are discharged, your primary care physician will handle any further medical issues. Please note that NO REFILLS for any discharge medications will be authorized once you are discharged, as it is imperative that you return to your primary care physician (or establish a relationship with a primary care physician if you do not have one) for your aftercare needs so that they can reassess your need for medications and monitor your  lab values.  You can reach the hospitalist office at phone [phone] or fax [phone]   If you do not have a primary care physician, you can call [phone] for a physician referral.  Activity: As tolerated with Full fall precautions use walker/cane & assistance as needed  Diet: regular  Disposition Home

## 2016-05-01 NOTE — Discharge Summary (Signed)
Physician Discharge Summary  Tonya [name] ZOX:[id number] DOB: 1980/08/11 DOA: 04/28/2016  PCP: Temple PaciniBONSU, OSEI A, DO  Admit date: 04/28/2016 Discharge date: 05/01/2016  Admitted From: home Disposition:  home  Recommendations for Outpatient Follow-up:  1. Follow up with PCP in 1-2 weeks 2. Recommend PCP to follow up on final urine cultures  Home Health: none Equipment/Devices: none   Discharge Condition: stable CODE STATUS: Full Diet recommendation: regular  HPI: Per Dr. Julian ReilGardner, Tonya NeptuneShannon M Coltrain is a 35 y.o. female with medical history significant of kidney stones in past.  Patient presents to the ED with c/o severe flank pain, anorexia and nausea, dysuria, fever.  Pain on both left and R flank, R side is worse.  Pain is severe.  Nothing makes pain better or worse.  Feels like prior kidney stones she says.  Pain onset about 1 week ago, persistent since onset.  Over past week has completed 2 courses of ABx as outpatient.  Hospital Course: Discharge Diagnoses:  Principal Problem:   Pyelonephritis Active Problems:   Nausea  UTI / Pyelonephritis - patient was admitted to the hospital and placed on IV antibiotics with Ceftriaxone. Her fever resolved and she had no WBC. She improved clinically, her flank pain has almost resolved and her nausea resolved and she is able to tolerate a regular diet. Her urine cultures are still pending at the time of discharge, patient asking to go home today, will narrow antibiotics to Ciprofloxacin for 11 additional days to complete a 14 day course for pyelonephritis.  Nephrolithiasis - CT renal stone with 5 mm stone at the left UVJ with suspected additional punctate stone in the distal left ureter just proximal to the left UVJ. Slight prominence of left ureter but no significant left hydronephrosis. D/w Dr. McDiarmid on 11/29, she is afebrile and without leukocytosis currently, there is high chance stone will pass spontaneously without need for  additional interventions.   Discharge Instructions     Medication List    STOP taking these medications   naproxen 500 MG tablet Commonly known as:  NAPROSYN     TAKE these medications   ciprofloxacin 500 MG tablet Commonly known as:  CIPRO Take 1 tablet (500 mg total) by mouth 2 (two) times daily.   cyclobenzaprine 10 MG tablet Commonly known as:  FLEXERIL Take 1 tablet (10 mg total) by mouth 3 (three) times daily as needed for muscle spasms.   ibuprofen 600 MG tablet Commonly known as:  ADVIL,MOTRIN Take 600 mg by mouth every 6 (six) hours as needed for mild pain.   ondansetron 4 MG disintegrating tablet Commonly known as:  ZOFRAN ODT Take 1 tablet (4 mg total) by mouth every 8 (eight) hours as needed for nausea or vomiting.   oxyCODONE-acetaminophen 5-325 MG tablet Commonly known as:  PERCOCET/ROXICET Take 1-2 tablets by mouth every 6 (six) hours as needed for severe pain.      Follow-up Information    BONSU, OSEI A, DO. Schedule an appointment as soon as possible for a visit in 2 week(s).   Specialty:  Internal Medicine Contact information: [facility] [place] [id number] [phone]          Allergies  Allergen Reactions  . Penicillins Hives    Has patient had a PCN reaction causing immediate rash, facial/tongue/throat swelling, SOB or lightheadedness with hypotension: Yes Has patient had a PCN reaction causing severe rash involving mucus membranes or skin necrosis: No Has patient had a PCN reaction that  required hospitalization No Has patient had a PCN reaction occurring within the last 10 years: No If all of the above answers are "NO", then may proceed with Cephalosporin use.     Consultations:  None   Procedures/Studies:  Ct Renal Stone Study  Result Date: 04/29/2016 CLINICAL DATA:  Severe flank pain with anorexia nausea dysuria and fever EXAM: CT ABDOMEN AND PELVIS WITHOUT CONTRAST TECHNIQUE: Multidetector CT imaging of the abdomen  and pelvis was performed following the standard protocol without IV contrast. COMPARISON:  11/01/2015 FINDINGS: Lower chest: Lung bases demonstrate mild dependent atelectasis. No acute consolidation or pleural effusion. Hepatobiliary: No focal liver abnormality is seen. No gallstones, gallbladder wall thickening, or biliary dilatation. Pancreas: Unremarkable. No pancreatic ductal dilatation or surrounding inflammatory changes. Spleen: Normal in size without focal abnormality. Adrenals/Urinary Tract: Adrenal glands are within normal limits. Mild to moderate left perinephric fat stranding. No significant left hydronephrosis. Minimally prominent left ureter. Multiple punctate stones within the lower poles of the kidneys. There is a 5 mm stone at the left UVJ. Suspected additional punctate stone in the distal left ureter slightly proximal to the left UVJ. No definitive right ureteral calculi are visualized. There is minimal right perinephric edema. Stomach/Bowel: Stomach is within normal limits. Appendix appears normal. No evidence of bowel wall thickening, distention, or inflammatory changes. Vascular/Lymphatic: No significant vascular findings are present. No enlarged abdominal or pelvic lymph nodes. Reproductive: Uterus and bilateral adnexa are unremarkable. Other: No abdominal wall hernia or abnormality. No abdominopelvic ascites. Musculoskeletal: No acute or significant osseous findings. IMPRESSION: 1. 5 mm stone at the left UVJ with suspected additional punctate stone in the distal left ureter just proximal to the left UVJ. Slight prominence of left ureter but no significant left hydronephrosis. 2. There are multiple additional punctate stones within both kidneys. There is no right hydronephrosis, hydroureter, or ureteral calculus on the right. 3. There is bilateral left greater than right perinephric fat stranding, findings could relate to a pyelonephritis. Electronically Signed   By: Jasmine PangKim  Fujinaga M.D.   On:  04/29/2016 00:15      Subjective: - no chest pain, shortness of breath, no abdominal pain, nausea or vomiting. Wants to go home   Discharge Exam: Vitals:   05/01/16 0522 05/01/16 1321  BP: 113/87 135/80  Pulse: (!) 59 84  Resp: 17 18  Temp: 97.7 F (36.5 C) 97.8 F (36.6 C)   Vitals:   04/30/16 1445 04/30/16 2112 05/01/16 0522 05/01/16 1321  BP: 121/83 (!) 98/46 113/87 135/80  Pulse: (!) 54 (!) 59 (!) 59 84  Resp: 17 17 17 18   Temp: 97.8 F (36.6 C) 97.9 F (36.6 C) 97.7 F (36.5 C) 97.8 F (36.6 C)  TempSrc: Oral Oral Oral Oral  SpO2: 100% 100% 99% 100%  Weight:      Height:        General: Pt is alert, awake, not in acute distress Cardiovascular: RRR, S1/S2 +, no rubs, no gallops Respiratory: CTA bilaterally, no wheezing, no rhonchi Abdominal: Soft, NT, ND, bowel sounds + Extremities: no edema, no cyanosis    The results of significant diagnostics from this hospitalization (including imaging, microbiology, ancillary and laboratory) are listed below for reference.     Microbiology: Recent Results (from the past 240 hour(s))  Culture, Urine     Status: Abnormal (Preliminary result)   Collection Time: 04/28/16  7:55 PM  Result Value Ref Range Status   Specimen Description URINE, CLEAN CATCH  Final   Special Requests NONE  Final   Culture >=100,000 COLONIES/mL ESCHERICHIA COLI (A)  Final   Report Status PENDING  Incomplete     Labs: BNP (last 3 results)  Recent Labs  12/17/15 0903  BNP 552.3*   Basic Metabolic Panel:  Recent Labs Lab 04/28/16 2301 04/29/16 1048 04/30/16 0602 05/01/16 0411  NA 134* 137 138 140  K 3.2* 3.3* 3.5 3.7  CL 108 111 110 113*  CO2 17* 20* 20* 19*  GLUCOSE 122* 103* 85 89  BUN 8 6 <5* <5*  CREATININE 0.84 0.85 0.84 0.73  CALCIUM 7.9* 8.0* 7.7* 8.0*  MG  --   --  1.8  --    Liver Function Tests: No results for input(s): AST, ALT, ALKPHOS, BILITOT, PROT, ALBUMIN in the last 168 hours. No results for input(s):  LIPASE, AMYLASE in the last 168 hours. No results for input(s): AMMONIA in the last 168 hours. CBC:  Recent Labs Lab 04/28/16 2301 04/29/16 1048 04/30/16 0602 05/01/16 0411  WBC 6.4 6.5 4.7 3.0*  NEUTROABS 4.7  --   --   --   HGB 12.1 11.9* 10.3* 10.9*  HCT 34.7* 34.6* 30.8* 33.0*  MCV 87.4 87.6 89.3 89.2  PLT 147* 164 158 172   Cardiac Enzymes: No results for input(s): CKTOTAL, CKMB, CKMBINDEX, TROPONINI in the last 168 hours. BNP: Invalid input(s): POCBNP CBG: No results for input(s): GLUCAP in the last 168 hours. D-Dimer No results for input(s): DDIMER in the last 72 hours. Hgb A1c No results for input(s): HGBA1C in the last 72 hours. Lipid Profile No results for input(s): CHOL, HDL, LDLCALC, TRIG, CHOLHDL, LDLDIRECT in the last 72 hours. Thyroid function studies No results for input(s): TSH, T4TOTAL, T3FREE, THYROIDAB in the last 72 hours.  Invalid input(s): FREET3 Anemia work up No results for input(s): VITAMINB12, FOLATE, FERRITIN, TIBC, IRON, RETICCTPCT in the last 72 hours. Urinalysis    Component Value Date/Time   COLORURINE AMBER (A) 04/28/2016 2015   APPEARANCEUR TURBID (A) 04/28/2016 2015   LABSPEC 1.019 04/28/2016 2015   PHURINE 5.5 04/28/2016 2015   GLUCOSEU NEGATIVE 04/28/2016 2015   HGBUR MODERATE (A) 04/28/2016 2015   BILIRUBINUR SMALL (A) 04/28/2016 2015   KETONESUR 15 (A) 04/28/2016 2015   PROTEINUR 100 (A) 04/28/2016 2015   UROBILINOGEN 1.0 02/04/2015 1917   NITRITE NEGATIVE 04/28/2016 2015   LEUKOCYTESUR LARGE (A) 04/28/2016 2015   Sepsis Labs Invalid input(s): PROCALCITONIN,  WBC,  LACTICIDVEN Microbiology Recent Results (from the past 240 hour(s))  Culture, Urine     Status: Abnormal (Preliminary result)   Collection Time: 04/28/16  7:55 PM  Result Value Ref Range Status   Specimen Description URINE, CLEAN CATCH  Final   Special Requests NONE  Final   Culture >=100,000 COLONIES/mL ESCHERICHIA COLI (A)  Final   Report Status PENDING   Incomplete     Time coordinating discharge: Over 30 minutes  SIGNED:  Pamella Pert, MD  [facility] 05/01/2016, 1:30 PM Pager [phone]  If 7PM-7AM, please contact night-coverage [email or web address] Password [email or web address]

## 2016-05-02 LAB — URINE CULTURE

## 2016-08-20 ENCOUNTER — Other Ambulatory Visit: Payer: Self-pay | Admitting: Obstetrics and Gynecology

## 2016-08-20 DIAGNOSIS — N6320 Unspecified lump in the left breast, unspecified quadrant: Secondary | ICD-10-CM

## 2016-08-22 ENCOUNTER — Ambulatory Visit
Admission: RE | Admit: 2016-08-22 | Discharge: 2016-08-22 | Disposition: A | Discharge status: Home / Self Care | Payer: Medicaid Other | Source: Ambulatory Visit | Attending: Obstetrics and Gynecology | Admitting: Obstetrics and Gynecology

## 2016-08-22 DIAGNOSIS — N6320 Unspecified lump in the left breast, unspecified quadrant: Secondary | ICD-10-CM

## 2017-03-25 IMAGING — DX DG CHEST 2V
2 series · 2 of 2 positions shown · non-contrast
Comparison: July 02, 2014

CLINICAL DATA: Shortness of breath and chills

EXAM:
CHEST  2 VIEW

[w chest lat]
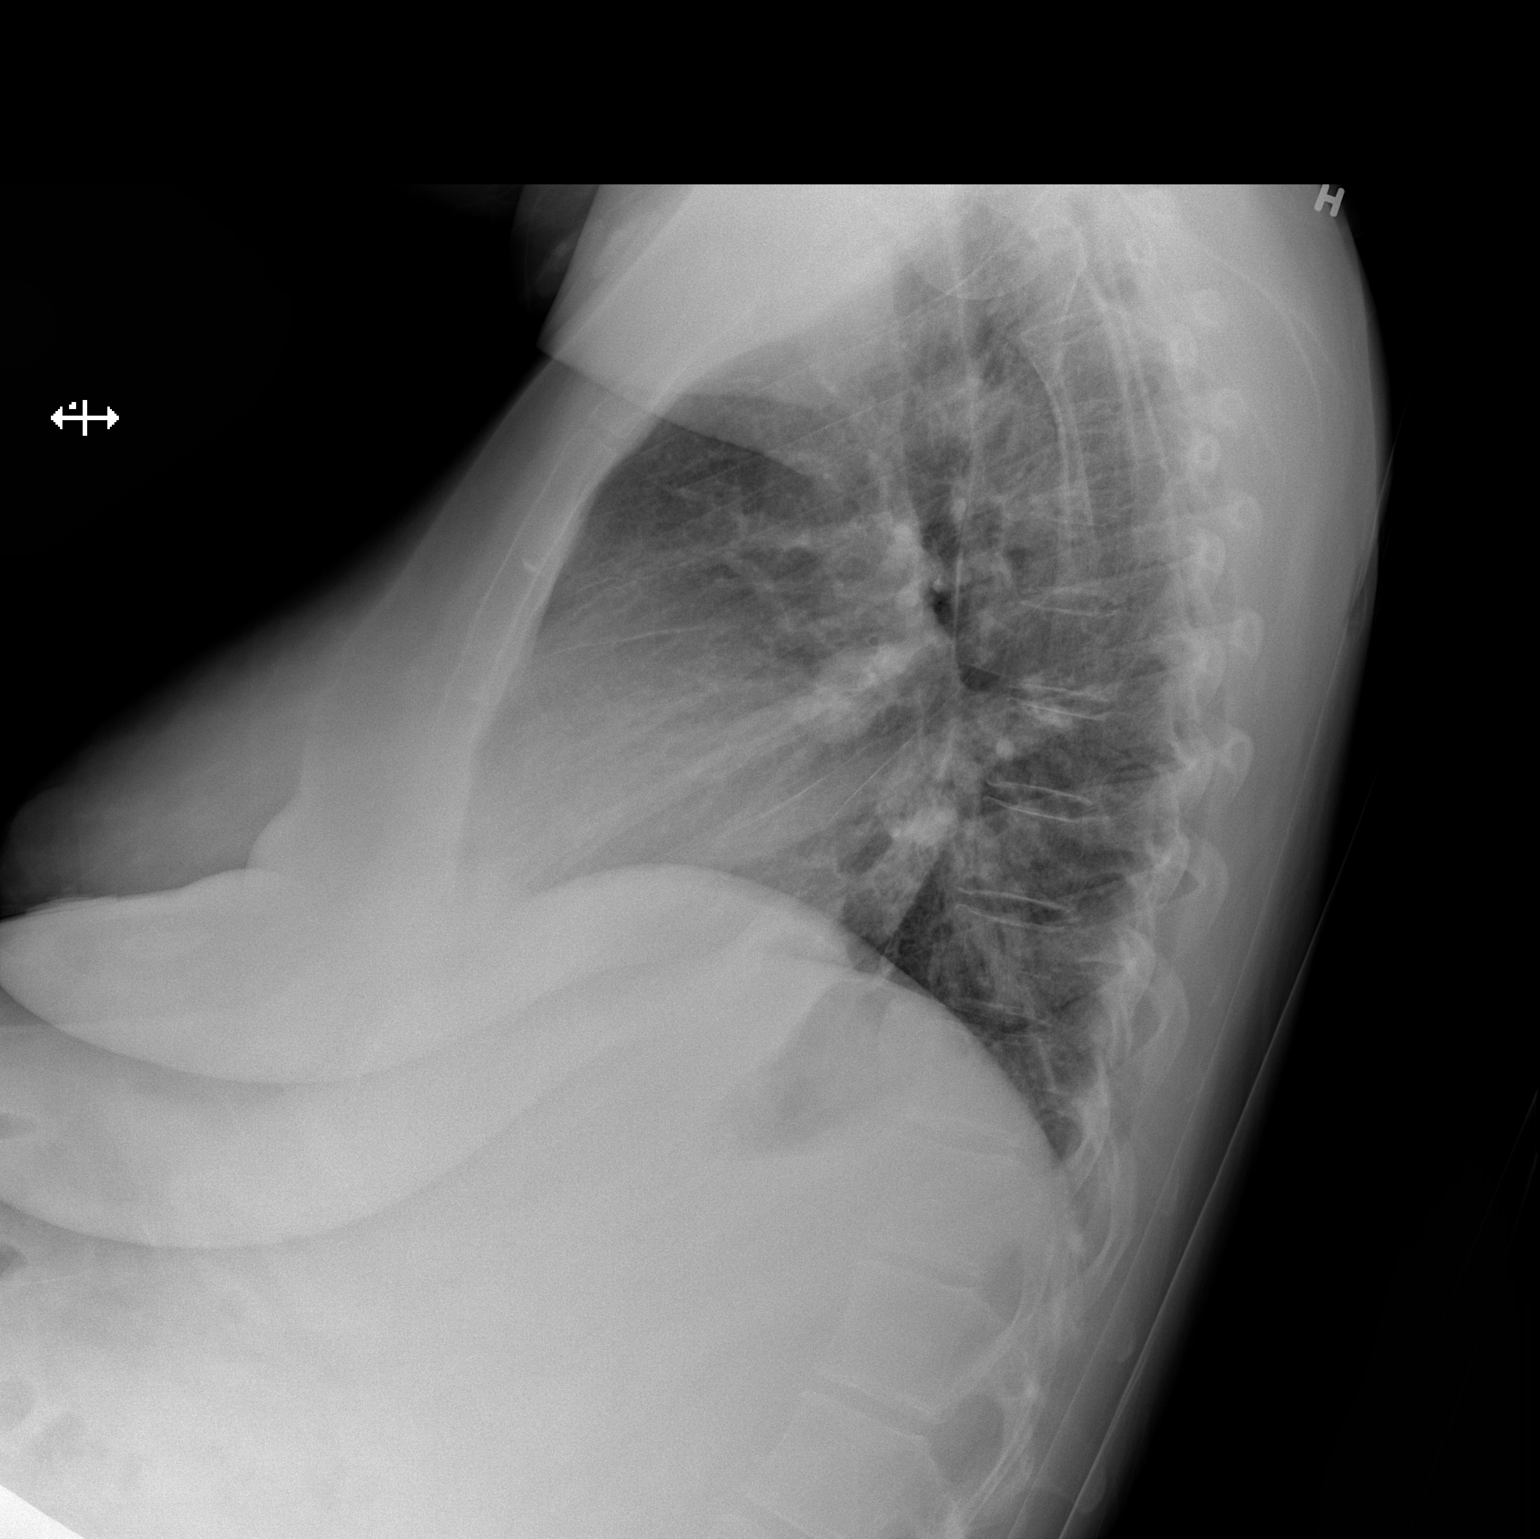

[x chest ap]
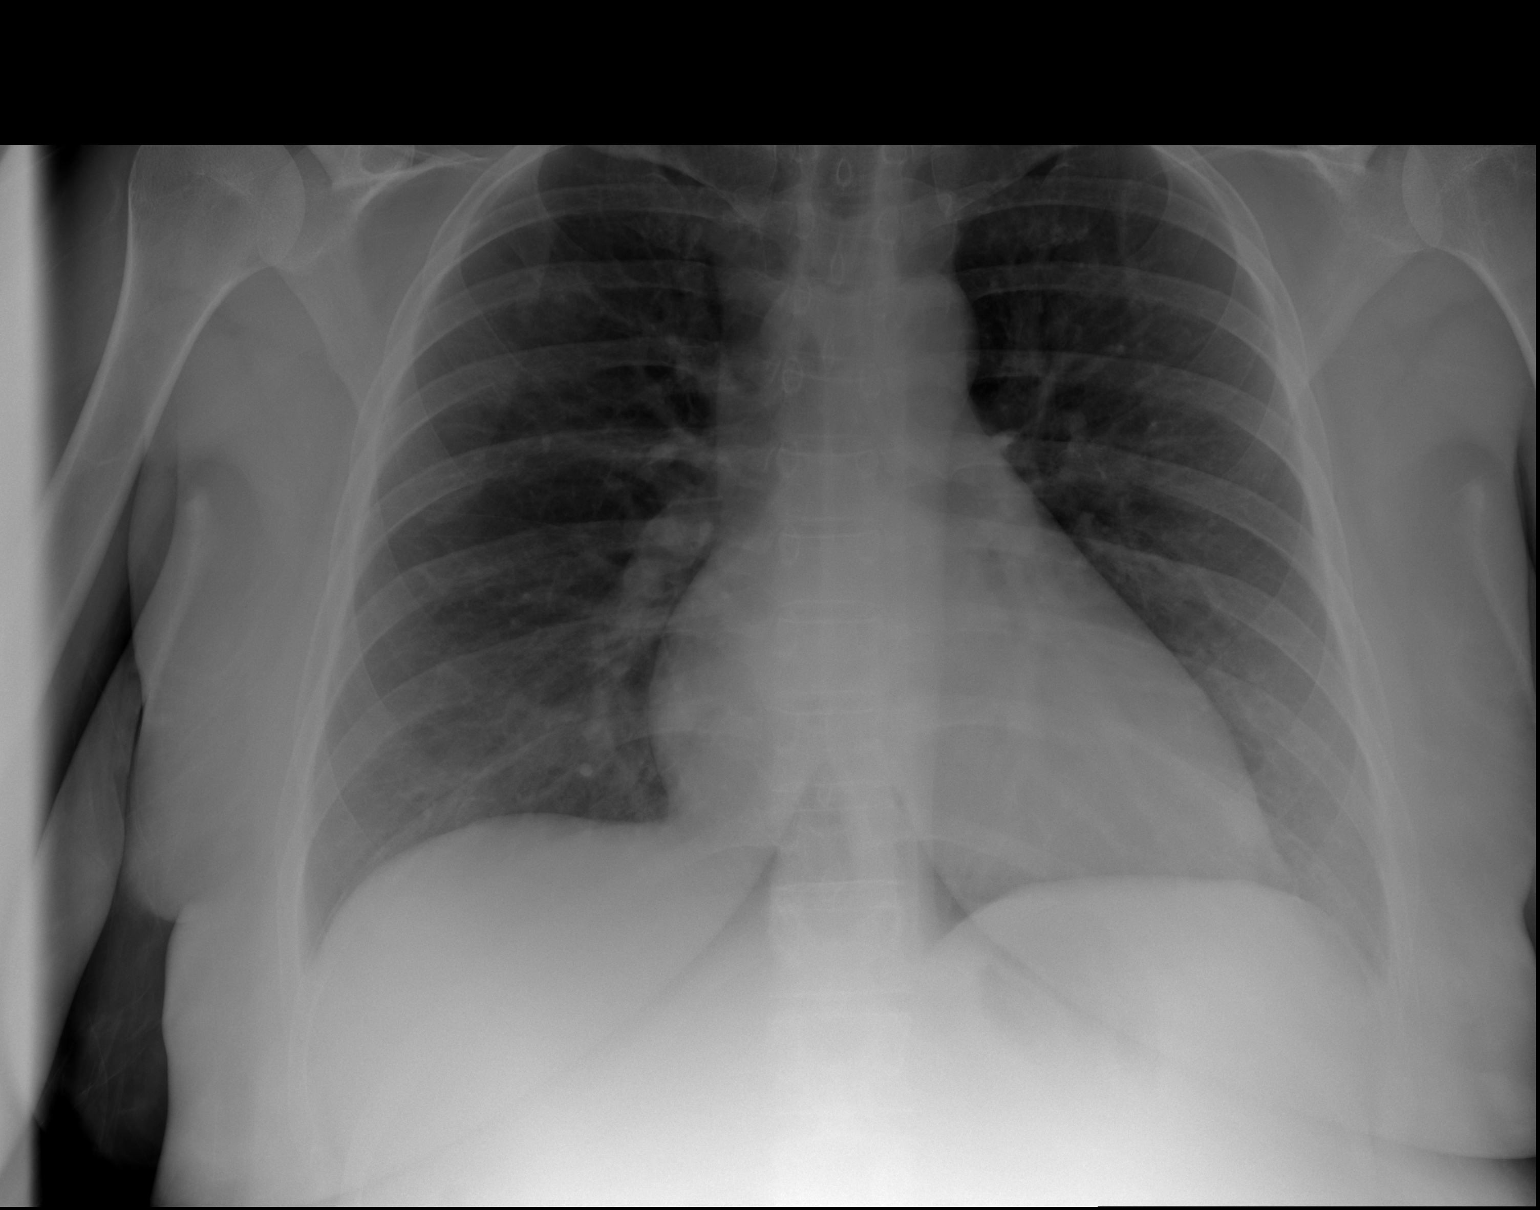

[2 of 2 positions shown; findings below may reference images not displayed]

FINDINGS: Lungs are clear. Heart is upper normal in size with pulmonary
vascularity within normal limits. No adenopathy. No bone lesions.
IMPRESSION: No edema or consolidation.

## 2018-01-29 ENCOUNTER — Other Ambulatory Visit: Payer: Self-pay

## 2018-01-29 ENCOUNTER — Emergency Department (HOSPITAL_COMMUNITY)
Admission: EM | Admit: 2018-01-29 | Discharge: 2018-01-29 | Disposition: A | Discharge status: Home / Self Care | Payer: Medicaid Other | Attending: Emergency Medicine | Admitting: Emergency Medicine

## 2018-01-29 DIAGNOSIS — Z5321 Procedure and treatment not carried out due to patient leaving prior to being seen by health care provider: Secondary | ICD-10-CM | POA: Insufficient documentation

## 2018-01-29 DIAGNOSIS — R1031 Right lower quadrant pain: Secondary | ICD-10-CM | POA: Diagnosis present

## 2018-01-29 LAB — BASIC METABOLIC PANEL
Anion gap: 9 (ref 5–15)
BUN: 10 mg/dL (ref 6–20)
CO2: 24 mmol/L (ref 22–32)
Calcium: 9.7 mg/dL (ref 8.9–10.3)
Chloride: 106 mmol/L (ref 98–111)
Creatinine, Ser: 0.86 mg/dL (ref 0.44–1.00)
GFR calc Af Amer: >60 mL/min (ref >60)
GLUCOSE: 101 mg/dL — AB (ref 70–99)
POTASSIUM: 3.9 mmol/L (ref 3.5–5.1)
Sodium: 139 mmol/L (ref 135–145)

## 2018-01-29 LAB — URINALYSIS, ROUTINE W REFLEX MICROSCOPIC
BILIRUBIN URINE: NEGATIVE
Glucose, UA: NEGATIVE mg/dL
KETONES UR: NEGATIVE mg/dL
LEUKOCYTES UA: NEGATIVE
Nitrite: NEGATIVE
Protein, ur: NEGATIVE mg/dL
Specific Gravity, Urine: 1.005 (ref 1.005–1.030)
pH: 5 (ref 5.0–8.0)

## 2018-01-29 LAB — CBC
HEMATOCRIT: 44.4 % (ref 36.0–46.0)
Hemoglobin: 14.8 g/dL (ref 12.0–15.0)
MCH: 31.6 pg (ref 26.0–34.0)
MCHC: 33.3 g/dL (ref 30.0–36.0)
MCV: 94.9 fL (ref 78.0–100.0)
Platelets: 191 10*3/uL (ref 150–400)
RBC: 4.68 MIL/uL (ref 3.87–5.11)
RDW: 12 % (ref 11.5–15.5)
WBC: 8.8 10*3/uL (ref 4.0–10.5)

## 2018-01-29 LAB — I-STAT BETA HCG BLOOD, ED (MC, WL, AP ONLY)

## 2018-01-29 NOTE — ED Triage Notes (Signed)
Patient c/o right flank pain that started for 2-3 weeks and has gotten increasingly worse. Saw her primary doctor and was dx with a UTI and took/completed abx.

## 2018-01-29 NOTE — ED Notes (Signed)
RN called patient patient did not answer.

## 2018-07-19 ENCOUNTER — Emergency Department (HOSPITAL_COMMUNITY): Payer: Self-pay

## 2018-07-19 ENCOUNTER — Other Ambulatory Visit: Payer: Self-pay

## 2018-07-19 ENCOUNTER — Emergency Department (HOSPITAL_COMMUNITY)
Admission: EM | Admit: 2018-07-19 | Discharge: 2018-07-19 | Disposition: A | Discharge status: Home / Self Care | Payer: Self-pay | Attending: Emergency Medicine | Admitting: Emergency Medicine

## 2018-07-19 ENCOUNTER — Encounter (HOSPITAL_COMMUNITY): Payer: Self-pay | Admitting: Emergency Medicine

## 2018-07-19 DIAGNOSIS — Z87891 Personal history of nicotine dependence: Secondary | ICD-10-CM | POA: Insufficient documentation

## 2018-07-19 DIAGNOSIS — R102 Pelvic and perineal pain: Secondary | ICD-10-CM | POA: Insufficient documentation

## 2018-07-19 LAB — BASIC METABOLIC PANEL
Anion gap: 6 (ref 5–15)
BUN: 9 mg/dL (ref 6–20)
CO2: 28 mmol/L (ref 22–32)
Calcium: 9 mg/dL (ref 8.9–10.3)
Chloride: 106 mmol/L (ref 98–111)
Creatinine, Ser: 1.04 mg/dL — ABNORMAL HIGH (ref 0.44–1.00)
GFR calc Af Amer: >60 mL/min (ref >60)
GFR calc non Af Amer: >60 mL/min (ref >60)
Glucose, Bld: 79 mg/dL (ref 70–99)
Potassium: 4.1 mmol/L (ref 3.5–5.1)
Sodium: 140 mmol/L (ref 135–145)

## 2018-07-19 LAB — CBC WITH DIFFERENTIAL/PLATELET
Abs Immature Granulocytes: 0.01 10*3/uL (ref 0.00–0.07)
Basophils Absolute: 0 10*3/uL (ref 0.0–0.1)
Basophils Relative: 0 %
Eosinophils Absolute: 0.1 10*3/uL (ref 0.0–0.5)
Eosinophils Relative: 2 %
HCT: 42.8 % (ref 36.0–46.0)
Hemoglobin: 13.9 g/dL (ref 12.0–15.0)
Immature Granulocytes: 0 %
Lymphocytes Relative: 28 %
Lymphs Abs: 1.7 10*3/uL (ref 0.7–4.0)
MCH: 31.4 pg (ref 26.0–34.0)
MCHC: 32.5 g/dL (ref 30.0–36.0)
MCV: 96.6 fL (ref 80.0–100.0)
Monocytes Absolute: 0.6 10*3/uL (ref 0.1–1.0)
Monocytes Relative: 9 %
Neutro Abs: 3.8 10*3/uL (ref 1.7–7.7)
Neutrophils Relative %: 61 %
Platelets: 210 10*3/uL (ref 150–400)
RBC: 4.43 MIL/uL (ref 3.87–5.11)
RDW: 12 % (ref 11.5–15.5)
WBC: 6.2 10*3/uL (ref 4.0–10.5)
nRBC: 0 % (ref 0.0–0.2)

## 2018-07-19 LAB — URINALYSIS, ROUTINE W REFLEX MICROSCOPIC
Bilirubin Urine: NEGATIVE
Glucose, UA: NEGATIVE mg/dL
Ketones, ur: NEGATIVE mg/dL
Leukocytes,Ua: NEGATIVE
Nitrite: NEGATIVE
Protein, ur: NEGATIVE mg/dL
Specific Gravity, Urine: 1.025 (ref 1.005–1.030)
pH: 5 (ref 5.0–8.0)

## 2018-07-19 LAB — WET PREP, GENITAL
Clue Cells Wet Prep HPF POC: NONE SEEN
Trich, Wet Prep: NONE SEEN
Yeast Wet Prep HPF POC: NONE SEEN

## 2018-07-19 LAB — POC URINE PREG, ED: Preg Test, Ur: NEGATIVE

## 2018-07-19 MED ORDER — KETOROLAC TROMETHAMINE 30 MG/ML IJ SOLN
30.0000 mg | Freq: Once | INTRAMUSCULAR | Status: AC
  Administered 2018-07-19: 30 mg via INTRAMUSCULAR
  Filled 2018-07-19: qty 1

## 2018-07-19 MED ORDER — LIDOCAINE HCL (PF) 1 % IJ SOLN
INTRAMUSCULAR | Status: AC
  Administered 2018-07-19: 0.9 mL
  Filled 2018-07-19: qty 5

## 2018-07-19 MED ORDER — DOXYCYCLINE HYCLATE 100 MG PO CAPS: 100.0000 mg | ORAL_CAPSULE | Freq: Two times a day (BID) | ORAL | 0 refills | Status: DC

## 2018-07-19 MED ORDER — CEFTRIAXONE SODIUM 250 MG IJ SOLR
250.0000 mg | Freq: Once | INTRAMUSCULAR | Status: AC
  Administered 2018-07-19: 250 mg via INTRAMUSCULAR
  Filled 2018-07-19: qty 250

## 2018-07-19 NOTE — ED Notes (Signed)
Pt verbalized understanding of discharge instructions and denies any further questions at this time.   

## 2018-07-19 NOTE — ED Notes (Signed)
Got patient undress did vitals patient is resting with call bell in reach

## 2018-07-19 NOTE — Discharge Instructions (Addendum)
Please read attached information. If you experience any new or worsening signs or symptoms please return to the emergency room for evaluation. Please follow-up with your primary care provider or specialist as discussed. Please use medication prescribed only as directed and discontinue taking if you have any concerning signs or symptoms.   °

## 2018-07-19 NOTE — ED Notes (Signed)
ED Provider at bedside. 

## 2018-07-19 NOTE — ED Provider Notes (Signed)
[facility] Provider Note   CSN: [id number] Arrival date & time: 07/19/18  1131   History   Chief Complaint Chief Complaint  Patient presents with  . Pelvic Pain    HPI Parks NeptuneShannon M Mundt is a 38 y.o. female.  HPI   38 year old female presents today with complaints of pelvic pain.  Patient notes approximately 2-week history of left-sided pelvic pain.  She notes increased mucousy discharge and 4 menstrual cycles in the last 2 months.  Patient denies any fever or upper abdominal pain or systemic symptoms.  She notes she is status post tubal ligation.  Patient also notes increased odor of her urine without dysuria.  She notes she is sexually active with a female partner; this is a new sexual partner within the last 2 months.    Past Medical History:  Diagnosis Date  . Heart palpitations   . Kidney stone complicating pregnancy     Patient Active Problem List   Diagnosis Date Noted  . Pyelonephritis 04/28/2016  . Postpartum hypertension 12/17/2015  . S/P cesarean section 12/10/2015  . Pregnancy with nephrolithiasis in third trimester 11/12/2015  . Nephrolithiasis 11/01/2015  . Dysuria 10/24/2015  . Pregnancy with generalized abdominal pain, antepartum 10/24/2015  . Nausea 10/24/2015    Past Surgical History:  Procedure Laterality Date  . CESAREAN SECTION    . CESAREAN SECTION WITH BILATERAL TUBAL LIGATION Bilateral 12/10/2015   Procedure: REPEAT CESAREAN SECTION WITH BILATERAL TUBAL LIGATION;  Surgeon: Lavina Hammanodd Meisinger, MD;  [facility];  Service: Obstetrics;  Laterality: Bilateral;  . DILATION AND CURETTAGE OF UTERUS    . kidney stones  May-June   to MAU 4x     OB History    Gravida  4   Para  3   Term  3   Preterm      AB  1   Living  1     SAB  1   TAB      Ectopic      Multiple  0   Live Births  1            Home Medications    Prior to Admission medications   Medication Sig Start Date  End Date Taking? Authorizing Provider  ciprofloxacin (CIPRO) 500 MG tablet Take 1 tablet (500 mg total) by mouth 2 (two) times daily. 05/01/16   Leatha GildingGherghe, Costin M, MD  cyclobenzaprine (FLEXERIL) 10 MG tablet Take 1 tablet (10 mg total) by mouth 3 (three) times daily as needed for muscle spasms. 04/02/16   Sudie GrumblingAmyot, Ann Berry, NP  doxycycline (VIBRAMYCIN) 100 MG capsule Take 1 capsule (100 mg total) by mouth 2 (two) times daily. 07/19/18   Cristian Grieves, Tinnie GensJeffrey, PA-C  ibuprofen (ADVIL,MOTRIN) 600 MG tablet Take 600 mg by mouth every 6 (six) hours as needed for mild pain.    [provider]  ondansetron (ZOFRAN ODT) 4 MG disintegrating tablet Take 1 tablet (4 mg total) by mouth every 8 (eight) hours as needed for nausea or vomiting. 05/01/16   Leatha GildingGherghe, Costin M, MD  oxyCODONE-acetaminophen (PERCOCET/ROXICET) 5-325 MG tablet Take 1-2 tablets by mouth every 6 (six) hours as needed for severe pain. 05/01/16   Leatha GildingGherghe, Costin M, MD    Family History Family History  Problem Relation Age of Onset  . Alcohol abuse Neg Hx   . Arthritis Neg Hx   . Asthma Neg Hx   . Birth defects Neg Hx   . Cancer Neg Hx   .  COPD Neg Hx   . Depression Neg Hx   . Diabetes Neg Hx   . Drug abuse Neg Hx   . Early death Neg Hx   . Hearing loss Neg Hx   . Heart disease Neg Hx   . Hyperlipidemia Neg Hx   . Hypertension Neg Hx   . Kidney disease Neg Hx   . Learning disabilities Neg Hx   . Mental illness Neg Hx   . Mental retardation Neg Hx   . Miscarriages / Stillbirths Neg Hx   . Stroke Neg Hx   . Vision loss Neg Hx   . Varicose Veins Neg Hx     Social History Social History   Tobacco Use  . Smoking status: Former Smoker    Packs/day: 1.00    Types: Cigarettes  . Smokeless tobacco: Former Neurosurgeon    Quit date: 04/17/2003  Substance Use Topics  . Alcohol use: No  . Drug use: No     Allergies   Penicillins   Review of Systems Review of Systems  All other systems reviewed and are  negative.    Physical Exam Updated Vital Signs BP 110/75 (BP Location: Right Arm)   Pulse 68   Temp 97.7 F (36.5 C) (Oral)   Resp 18   Ht 5\' 3"  (1.6 m)   Wt 68 kg   LMP 07/17/2018 (Exact Date)   SpO2 100%   BMI 26.57 kg/m   Physical Exam Vitals signs and nursing note reviewed.  Constitutional:      Appearance: She is well-developed.  HENT:     Head: Normocephalic and atraumatic.  Eyes:     General: No scleral icterus.       Right eye: No discharge.        Left eye: No discharge.     Conjunctiva/sclera: Conjunctivae normal.     Pupils: Pupils are equal, round, and reactive to light.  Neck:     Musculoskeletal: Normal range of motion.     Vascular: No JVD.     Trachea: No tracheal deviation.  Pulmonary:     Effort: Pulmonary effort is normal.     Breath sounds: No stridor.  Abdominal:     Comments: Minor tenderness palpation of lower pelvis, nonfocal, no rebound or guarding, abdomen soft nontender  Genitourinary:    Comments: Mucus discharge, no purulence, no cervical motion tenderness, no adnexal masses noted Neurological:     Mental Status: She is alert and oriented to person, place, and time.     Coordination: Coordination normal.  Psychiatric:        Behavior: Behavior normal.        Thought Content: Thought content normal.        Judgment: Judgment normal.      ED Treatments / Results  Labs (all labs ordered are listed, but only abnormal results are displayed) Labs Reviewed  WET PREP, GENITAL - Abnormal; Notable for the following components:      Result Value   WBC, Wet Prep HPF POC MANY (*)    All other components within normal limits  BASIC METABOLIC PANEL - Abnormal; Notable for the following components:   Creatinine, Ser 1.04 (*)    All other components within normal limits  URINALYSIS, ROUTINE W REFLEX MICROSCOPIC - Abnormal; Notable for the following components:   APPearance HAZY (*)    Hgb urine dipstick SMALL (*)    Bacteria, UA MANY (*)     All other components within normal limits  CBC WITH DIFFERENTIAL/PLATELET  POC URINE PREG, ED  GC/CHLAMYDIA PROBE AMP ([place]) NOT AT [facility]    EKG None  Radiology [place] Transvaginal Non-ob  Result Date: 07/19/2018 CLINICAL DATA:  Pelvic pain EXAM: TRANSABDOMINAL AND TRANSVAGINAL ULTRASOUND OF PELVIS DOPPLER ULTRASOUND OF OVARIES TECHNIQUE: Study was performed transabdominally to optimize pelvic field of view evaluation and transvaginally to optimize internal visceral architecture evaluation. Color and duplex Doppler ultrasound was utilized to evaluate blood flow to the ovaries. COMPARISON:  CT abdomen and pelvis April 28, 2016 FINDINGS: Uterus Measurements: 10.2 x 4.4 x 5.3 cm = volume: 123.5 mL. No fibroids or other mass visualized. Caesarian section scar noted. Endometrium Thickness: 6 mm.  No focal abnormality visualized. Right ovary Measurements: 3.2 x 1.6 x 2.7 cm = volume: 7.2 mL. Normal appearance/no adnexal mass beyond dominant follicle measuring 1.4 x 1.4 x 0.9 cm. Left ovary Measurements: 2.6 x 1.6 x 1.7 cm = volume: 3.6 mL. Normal appearance/no adnexal mass. Small physiologic follicles noted incidentally on the left. Pulsed Doppler evaluation of both ovaries demonstrates normal low-resistance arterial and venous waveforms. Other findings No abnormal free fluid. IMPRESSION: 1. No ovarian torsion. Ovaries appear normal. There is a dominant follicle arising from the right ovary. 2. Caesarian section scar in uterus. Uterus otherwise appears unremarkable. Endometrium is normal in thickness. Electronically Signed   By: Bretta BangWilliam  Woodruff III M.D.   On: 07/19/2018 14:08   [place] Pelvis Complete  Result Date: 07/19/2018 CLINICAL DATA:  Pelvic pain EXAM: TRANSABDOMINAL AND TRANSVAGINAL ULTRASOUND OF PELVIS DOPPLER ULTRASOUND OF OVARIES TECHNIQUE: Study was performed transabdominally to optimize pelvic field of view evaluation and transvaginally to optimize internal visceral architecture  evaluation. Color and duplex Doppler ultrasound was utilized to evaluate blood flow to the ovaries. COMPARISON:  CT abdomen and pelvis April 28, 2016 FINDINGS: Uterus Measurements: 10.2 x 4.4 x 5.3 cm = volume: 123.5 mL. No fibroids or other mass visualized. Caesarian section scar noted. Endometrium Thickness: 6 mm.  No focal abnormality visualized. Right ovary Measurements: 3.2 x 1.6 x 2.7 cm = volume: 7.2 mL. Normal appearance/no adnexal mass beyond dominant follicle measuring 1.4 x 1.4 x 0.9 cm. Left ovary Measurements: 2.6 x 1.6 x 1.7 cm = volume: 3.6 mL. Normal appearance/no adnexal mass. Small physiologic follicles noted incidentally on the left. Pulsed Doppler evaluation of both ovaries demonstrates normal low-resistance arterial and venous waveforms. Other findings No abnormal free fluid. IMPRESSION: 1. No ovarian torsion. Ovaries appear normal. There is a dominant follicle arising from the right ovary. 2. Caesarian section scar in uterus. Uterus otherwise appears unremarkable. Endometrium is normal in thickness. Electronically Signed   By: Bretta BangWilliam  Woodruff III M.D.   On: 07/19/2018 14:08   [place] Art/ven Flow Abd Pelv Doppler  Result Date: 07/19/2018 CLINICAL DATA:  Pelvic pain EXAM: TRANSABDOMINAL AND TRANSVAGINAL ULTRASOUND OF PELVIS DOPPLER ULTRASOUND OF OVARIES TECHNIQUE: Study was performed transabdominally to optimize pelvic field of view evaluation and transvaginally to optimize internal visceral architecture evaluation. Color and duplex Doppler ultrasound was utilized to evaluate blood flow to the ovaries. COMPARISON:  CT abdomen and pelvis April 28, 2016 FINDINGS: Uterus Measurements: 10.2 x 4.4 x 5.3 cm = volume: 123.5 mL. No fibroids or other mass visualized. Caesarian section scar noted. Endometrium Thickness: 6 mm.  No focal abnormality visualized. Right ovary Measurements: 3.2 x 1.6 x 2.7 cm = volume: 7.2 mL. Normal appearance/no adnexal mass beyond dominant follicle measuring 1.4 x 1.4  x 0.9 cm. Left ovary Measurements: 2.6 x 1.6 x 1.7  cm = volume: 3.6 mL. Normal appearance/no adnexal mass. Small physiologic follicles noted incidentally on the left. Pulsed Doppler evaluation of both ovaries demonstrates normal low-resistance arterial and venous waveforms. Other findings No abnormal free fluid. IMPRESSION: 1. No ovarian torsion. Ovaries appear normal. There is a dominant follicle arising from the right ovary. 2. Caesarian section scar in uterus. Uterus otherwise appears unremarkable. Endometrium is normal in thickness. Electronically Signed   By: Bretta Bang III M.D.   On: 07/19/2018 14:08    Procedures Procedures (including critical care time)  Medications Ordered in ED Medications  ketorolac (TORADOL) 30 MG/ML injection 30 mg (30 mg Intramuscular Given 07/19/18 1427)  cefTRIAXone (ROCEPHIN) injection 250 mg (250 mg Intramuscular Given 07/19/18 1432)  lidocaine (PF) (XYLOCAINE) 1 % injection (0.9 mLs  Given 07/19/18 1432)     Initial Impression / Assessment and Plan / ED Course  I have reviewed the triage vital signs and the nursing notes.  Pertinent labs & imaging results that were available during my care of the patient were reviewed by me and considered in my medical decision making (see chart for details).     Labs: Wet prep, GC, CBC, BMP, urinalysis  Imaging: Pelvic ultrasound  Consults:  Therapeutics: Ceftriaxone Toradol  Discharge Meds: Doxycycline  Assessment/Plan: 38 year old female presents today with pelvic pain.  Patient's pain started around the same time she had a new sexual partner.  She is having discharge although this does not appear purulent.  Given her onset of symptoms with discharge I discussed prophylactic treatment for STDs, patient would like to proceed at this time.  She will be given ceftriaxone here doxycycline at home, she will follow-up as an outpatient with OB/GYN, strict return precautions given.  Verbalized understanding and  agreement to today's plan had no further questions or concerns.  Final Clinical Impressions(s) / ED Diagnoses   Final diagnoses:  Pelvic pain in female    ED Discharge Orders         Ordered    doxycycline (VIBRAMYCIN) 100 MG capsule  2 times daily     07/19/18 1500           Eyvonne Mechanic, PA-C 07/19/18 1557    Long, Arlyss Repress, MD 07/20/18 [phone]

## 2018-07-19 NOTE — ED Notes (Signed)
Patient transported to Ultrasound 

## 2018-07-19 NOTE — ED Triage Notes (Signed)
Pt with pelvic pain and irregular periods. Reports bloody discharge. Denies fevers at this time. She is a/o x4. NAD.

## 2018-07-20 LAB — GC/CHLAMYDIA PROBE AMP (~~LOC~~) NOT AT ARMC
Chlamydia: NEGATIVE
Neisseria Gonorrhea: NEGATIVE

## 2018-12-09 ENCOUNTER — Other Ambulatory Visit: Payer: Self-pay

## 2018-12-09 ENCOUNTER — Emergency Department (HOSPITAL_COMMUNITY): Payer: Self-pay

## 2018-12-09 ENCOUNTER — Emergency Department (HOSPITAL_COMMUNITY)
Admission: EM | Admit: 2018-12-09 | Discharge: 2018-12-09 | Disposition: A | Discharge status: Home / Self Care | Payer: Self-pay | Attending: Internal Medicine | Admitting: Internal Medicine

## 2018-12-09 DIAGNOSIS — S6991XA Unspecified injury of right wrist, hand and finger(s), initial encounter: Secondary | ICD-10-CM | POA: Insufficient documentation

## 2018-12-09 DIAGNOSIS — W500XXA Accidental hit or strike by another person, initial encounter: Secondary | ICD-10-CM | POA: Insufficient documentation

## 2018-12-09 DIAGNOSIS — W1830XA Fall on same level, unspecified, initial encounter: Secondary | ICD-10-CM | POA: Insufficient documentation

## 2018-12-09 DIAGNOSIS — S52509A Unspecified fracture of the lower end of unspecified radius, initial encounter for closed fracture: Secondary | ICD-10-CM

## 2018-12-09 DIAGNOSIS — Y999 Unspecified external cause status: Secondary | ICD-10-CM | POA: Insufficient documentation

## 2018-12-09 DIAGNOSIS — W19XXXA Unspecified fall, initial encounter: Secondary | ICD-10-CM

## 2018-12-09 DIAGNOSIS — Y929 Unspecified place or not applicable: Secondary | ICD-10-CM | POA: Insufficient documentation

## 2018-12-09 DIAGNOSIS — Z87891 Personal history of nicotine dependence: Secondary | ICD-10-CM | POA: Insufficient documentation

## 2018-12-09 DIAGNOSIS — S52592A Other fractures of lower end of left radius, initial encounter for closed fracture: Secondary | ICD-10-CM | POA: Insufficient documentation

## 2018-12-09 DIAGNOSIS — M25531 Pain in right wrist: Secondary | ICD-10-CM

## 2018-12-09 DIAGNOSIS — Y939 Activity, unspecified: Secondary | ICD-10-CM | POA: Insufficient documentation

## 2018-12-09 MED ORDER — HYDROCODONE-ACETAMINOPHEN 5-325 MG PO TABS
1.0000 | ORAL_TABLET | Freq: Once | ORAL | Status: AC
  Administered 2018-12-09: 1 via ORAL
  Filled 2018-12-09: qty 1

## 2018-12-09 MED ORDER — HYDROCODONE-ACETAMINOPHEN 5-325 MG PO TABS: 1.0000 | ORAL_TABLET | ORAL | 0 refills | Status: AC | PRN
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
## 2018-12-09 NOTE — Progress Notes (Signed)
Orthopedic Tech Progress Note Patient Details:  Tonya [name] 1981-05-13 [id number] sugartong on the left arm and velcro wrist splint on the right Ortho Devices Type of Ortho Device: Velcro wrist splint, Arm sling, Sugartong splint Ortho Device/Splint Location: ULE and URE Ortho Device/Splint Interventions: Application, Ordered, Adjustment   Post Interventions Patient Tolerated: Well Instructions Provided: Care of device, Adjustment of device   Janit Pagan 12/09/2018, 6:17 PM

## 2018-12-09 NOTE — ED Notes (Signed)
Patient verbalizes understanding of discharge instructions. Opportunity for questioning and answers were provided. Armband removed by staff, pt discharged from ED.  

## 2018-12-09 NOTE — Discharge Instructions (Addendum)
Your left wrist was placed on a splint, please keep this clean and dry.  Your right wrist was also placed on a splint, please keep this in place until you follow-up with orthopedics.  I have provided a short course of pain medication, please take this as prescribed.  You may also alternate ibuprofen or Tylenol in addition to this medication.  Dr. Tama Headings information is attached to your chart, schedule an appointment for further management of your distal radius fractures.
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
## 2020-01-11 ENCOUNTER — Other Ambulatory Visit: Payer: Self-pay

## 2020-01-11 ENCOUNTER — Emergency Department (HOSPITAL_COMMUNITY)
Admission: EM | Admit: 2020-01-11 | Discharge: 2020-01-12 | Disposition: A | Discharge status: Home / Self Care | Payer: No Typology Code available for payment source | Attending: Emergency Medicine | Admitting: Emergency Medicine

## 2020-01-11 ENCOUNTER — Emergency Department (HOSPITAL_COMMUNITY): Payer: No Typology Code available for payment source

## 2020-01-11 DIAGNOSIS — R079 Chest pain, unspecified: Secondary | ICD-10-CM | POA: Insufficient documentation

## 2020-01-11 DIAGNOSIS — R0602 Shortness of breath: Secondary | ICD-10-CM | POA: Insufficient documentation

## 2020-01-11 DIAGNOSIS — Z5321 Procedure and treatment not carried out due to patient leaving prior to being seen by health care provider: Secondary | ICD-10-CM | POA: Diagnosis not present

## 2020-01-11 LAB — CBC
HCT: 43.1 % (ref 36.0–46.0)
Hemoglobin: 14.4 g/dL (ref 12.0–15.0)
MCH: 31.7 pg (ref 26.0–34.0)
MCHC: 33.4 g/dL (ref 30.0–36.0)
MCV: 94.9 fL (ref 80.0–100.0)
Platelets: 257 10*3/uL (ref 150–400)
RBC: 4.54 MIL/uL (ref 3.87–5.11)
RDW: 12.4 % (ref 11.5–15.5)
WBC: 5.8 10*3/uL (ref 4.0–10.5)
nRBC: 0 % (ref 0.0–0.2)

## 2020-01-11 LAB — BASIC METABOLIC PANEL
Anion gap: 10 (ref 5–15)
BUN: 12 mg/dL (ref 6–20)
CO2: 23 mmol/L (ref 22–32)
Calcium: 9.5 mg/dL (ref 8.9–10.3)
Chloride: 106 mmol/L (ref 98–111)
Creatinine, Ser: 0.77 mg/dL (ref 0.44–1.00)
GFR calc Af Amer: >60 mL/min (ref >60)
GFR calc non Af Amer: >60 mL/min (ref >60)
Glucose, Bld: 102 mg/dL — ABNORMAL HIGH (ref 70–99)
Potassium: 3.8 mmol/L (ref 3.5–5.1)
Sodium: 139 mmol/L (ref 135–145)

## 2020-01-11 LAB — TROPONIN I (HIGH SENSITIVITY): Troponin I (High Sensitivity): 2 ng/L (ref <18)

## 2020-01-11 NOTE — ED Triage Notes (Signed)
Pt c/o CP x3 days, states has been exerting more "than normal". Also c/o SOB.

## 2020-01-12 NOTE — ED Notes (Signed)
Patient called x2 for vitals recheck with no response  

## 2020-01-24 ENCOUNTER — Other Ambulatory Visit: Payer: Self-pay

## 2020-01-24 ENCOUNTER — Other Ambulatory Visit: Payer: Self-pay | Admitting: Critical Care Medicine

## 2020-01-24 DIAGNOSIS — Z20822 Contact with and (suspected) exposure to covid-19: Secondary | ICD-10-CM | POA: Diagnosis not present

## 2020-01-25 LAB — NOVEL CORONAVIRUS, NAA: SARS-CoV-2, NAA: NOT DETECTED

## 2020-01-25 LAB — SARS-COV-2, NAA 2 DAY TAT

## 2020-09-08 DIAGNOSIS — K449 Diaphragmatic hernia without obstruction or gangrene: Secondary | ICD-10-CM | POA: Diagnosis not present

## 2020-09-08 DIAGNOSIS — N200 Calculus of kidney: Secondary | ICD-10-CM | POA: Diagnosis not present

## 2020-09-08 DIAGNOSIS — N3289 Other specified disorders of bladder: Secondary | ICD-10-CM | POA: Diagnosis not present

## 2020-09-11 DIAGNOSIS — N12 Tubulo-interstitial nephritis, not specified as acute or chronic: Secondary | ICD-10-CM | POA: Diagnosis not present

## 2020-12-04 DIAGNOSIS — N200 Calculus of kidney: Secondary | ICD-10-CM | POA: Diagnosis not present

## 2020-12-04 DIAGNOSIS — N390 Urinary tract infection, site not specified: Secondary | ICD-10-CM | POA: Diagnosis not present

## 2020-12-11 DIAGNOSIS — I808 Phlebitis and thrombophlebitis of other sites: Secondary | ICD-10-CM | POA: Diagnosis not present

## 2020-12-11 DIAGNOSIS — L03114 Cellulitis of left upper limb: Secondary | ICD-10-CM | POA: Diagnosis not present

## 2020-12-11 DIAGNOSIS — M79632 Pain in left forearm: Secondary | ICD-10-CM | POA: Diagnosis not present

## 2020-12-11 DIAGNOSIS — M79602 Pain in left arm: Secondary | ICD-10-CM | POA: Diagnosis not present

## 2020-12-11 DIAGNOSIS — I82612 Acute embolism and thrombosis of superficial veins of left upper extremity: Secondary | ICD-10-CM | POA: Diagnosis not present

## 2020-12-11 DIAGNOSIS — I809 Phlebitis and thrombophlebitis of unspecified site: Secondary | ICD-10-CM | POA: Diagnosis not present

## 2020-12-11 DIAGNOSIS — R110 Nausea: Secondary | ICD-10-CM | POA: Diagnosis not present

## 2021-01-31 DIAGNOSIS — Z0389 Encounter for observation for other suspected diseases and conditions ruled out: Secondary | ICD-10-CM | POA: Diagnosis not present

## 2021-05-11 ENCOUNTER — Encounter (HOSPITAL_COMMUNITY): Payer: Self-pay | Admitting: Emergency Medicine

## 2021-05-11 ENCOUNTER — Emergency Department (HOSPITAL_COMMUNITY)
Admission: EM | Admit: 2021-05-11 | Discharge: 2021-05-11 | Disposition: A | Discharge status: Home / Self Care | Payer: Medicaid Other | Attending: Emergency Medicine | Admitting: Emergency Medicine

## 2021-05-11 ENCOUNTER — Other Ambulatory Visit: Payer: Self-pay

## 2021-05-11 ENCOUNTER — Emergency Department (HOSPITAL_COMMUNITY): Payer: Medicaid Other

## 2021-05-11 DIAGNOSIS — Z87891 Personal history of nicotine dependence: Secondary | ICD-10-CM | POA: Diagnosis not present

## 2021-05-11 DIAGNOSIS — M5441 Lumbago with sciatica, right side: Secondary | ICD-10-CM | POA: Diagnosis not present

## 2021-05-11 DIAGNOSIS — M545 Low back pain, unspecified: Secondary | ICD-10-CM | POA: Insufficient documentation

## 2021-05-11 DIAGNOSIS — M5431 Sciatica, right side: Secondary | ICD-10-CM

## 2021-05-11 LAB — CBC WITH DIFFERENTIAL/PLATELET
Abs Immature Granulocytes: 0.02 10*3/uL (ref 0.00–0.07)
Basophils Absolute: 0 10*3/uL (ref 0.0–0.1)
Basophils Relative: 0 %
Eosinophils Absolute: 0.2 10*3/uL (ref 0.0–0.5)
Eosinophils Relative: 2 %
HCT: 46.4 % — ABNORMAL HIGH (ref 36.0–46.0)
Hemoglobin: 15.2 g/dL — ABNORMAL HIGH (ref 12.0–15.0)
Immature Granulocytes: 0 %
Lymphocytes Relative: 33 %
Lymphs Abs: 2 10*3/uL (ref 0.7–4.0)
MCH: 31 pg (ref 26.0–34.0)
MCHC: 32.8 g/dL (ref 30.0–36.0)
MCV: 94.5 fL (ref 80.0–100.0)
Monocytes Absolute: 0.7 10*3/uL (ref 0.1–1.0)
Monocytes Relative: 11 %
Neutro Abs: 3.3 10*3/uL (ref 1.7–7.7)
Neutrophils Relative %: 54 %
Platelets: 234 10*3/uL (ref 150–400)
RBC: 4.91 MIL/uL (ref 3.87–5.11)
RDW: 12.5 % (ref 11.5–15.5)
WBC: 6.2 10*3/uL (ref 4.0–10.5)
nRBC: 0 % (ref 0.0–0.2)

## 2021-05-11 LAB — I-STAT BETA HCG BLOOD, ED (MC, WL, AP ONLY): I-stat hCG, quantitative: <5 m[IU]/mL (ref <5)

## 2021-05-11 LAB — COMPREHENSIVE METABOLIC PANEL
ALT: 12 U/L (ref 0–44)
AST: 14 U/L — ABNORMAL LOW (ref 15–41)
Albumin: 3.9 g/dL (ref 3.5–5.0)
Alkaline Phosphatase: 45 U/L (ref 38–126)
Anion gap: 9 (ref 5–15)
BUN: 14 mg/dL (ref 6–20)
CO2: 19 mmol/L — ABNORMAL LOW (ref 22–32)
Calcium: 9.2 mg/dL (ref 8.9–10.3)
Chloride: 108 mmol/L (ref 98–111)
Creatinine, Ser: 0.93 mg/dL (ref 0.44–1.00)
GFR, Estimated: >60 mL/min (ref >60)
Glucose, Bld: 79 mg/dL (ref 70–99)
Potassium: 4.3 mmol/L (ref 3.5–5.1)
Sodium: 136 mmol/L (ref 135–145)
Total Bilirubin: 0.9 mg/dL (ref 0.3–1.2)
Total Protein: 6.2 g/dL — ABNORMAL LOW (ref 6.5–8.1)

## 2021-05-11 LAB — URINALYSIS, ROUTINE W REFLEX MICROSCOPIC
Bilirubin Urine: NEGATIVE
Glucose, UA: NEGATIVE mg/dL
Hgb urine dipstick: NEGATIVE
Ketones, ur: NEGATIVE mg/dL
Nitrite: NEGATIVE
Protein, ur: NEGATIVE mg/dL
Specific Gravity, Urine: 1.024 (ref 1.005–1.030)
pH: 5 (ref 5.0–8.0)

## 2021-05-11 MED ORDER — OXYCODONE HCL 5 MG PO TABS
5.0000 mg | ORAL_TABLET | Freq: Once | ORAL | Status: AC
  Administered 2021-05-11: 5 mg via ORAL
  Filled 2021-05-11: qty 1

## 2021-05-11 MED ORDER — KETOROLAC TROMETHAMINE 15 MG/ML IJ SOLN
15.0000 mg | Freq: Once | INTRAMUSCULAR | Status: AC
  Administered 2021-05-11: 15 mg via INTRAMUSCULAR
  Filled 2021-05-11: qty 1

## 2021-05-11 MED ORDER — DIAZEPAM 5 MG PO TABS
5.0000 mg | ORAL_TABLET | Freq: Once | ORAL | Status: AC
  Administered 2021-05-11: 5 mg via ORAL
  Filled 2021-05-11: qty 1

## 2021-05-11 MED ORDER — ACETAMINOPHEN 325 MG PO TABS
650.0000 mg | ORAL_TABLET | Freq: Once | ORAL | Status: AC
  Administered 2021-05-11: 650 mg via ORAL
  Filled 2021-05-11: qty 2

## 2021-05-11 MED ORDER — OXYCODONE-ACETAMINOPHEN 5-325 MG PO TABS
1.0000 | ORAL_TABLET | Freq: Once | ORAL | Status: AC
  Administered 2021-05-11: 1 via ORAL
  Filled 2021-05-11: qty 1

## 2021-05-11 NOTE — ED Triage Notes (Signed)
C/o R flank pain x 4 days with cloudy urine.  Denies dysuria.  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  States pain radiates into R hip and R leg.  Hx of recurrent UTIs and pyelonephritis.

## 2021-05-11 NOTE — Discharge Instructions (Signed)

## 2021-05-11 NOTE — ED Provider Notes (Addendum)
[facility] EMERGENCY DEPARTMENT Provider Note   CSN: [id number] Arrival date & time: 05/11/21  3810     History Chief Complaint  Patient presents with   Flank Pain    Tonya [name] is a 40 y.o. female.  40 yo F with chief complaints of right-sided low back pain that radiates down the leg.  Has been going on for about 3 days now.  Worse with movement twisting palpation.  She denies any injury.  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  She had had issues with her kidneys in the past that had recurrent polynephritis that had her hospitalized that she thought this could be similar though did not have any urinary symptoms denies any fevers.  The history is provided by the patient.  Flank Pain This is a new problem. The current episode started more than 2 days ago. The problem occurs constantly. The problem has been gradually worsening. Pertinent negatives include no chest pain, no headaches and no shortness of breath. The symptoms are aggravated by bending and twisting. Nothing relieves the symptoms. She has tried nothing for the symptoms. The treatment provided no relief.      Past Medical History:  Diagnosis Date   Heart palpitations    Kidney stone complicating pregnancy     Patient Active Problem List   Diagnosis Date Noted   Pyelonephritis 04/28/2016   Postpartum hypertension 12/17/2015   S/P cesarean section 12/10/2015   Pregnancy with nephrolithiasis in third trimester 11/12/2015   Nephrolithiasis 11/01/2015   Dysuria 10/24/2015   Pregnancy with generalized abdominal pain, antepartum 10/24/2015   Nausea 10/24/2015    Past Surgical History:  Procedure Laterality Date   CESAREAN SECTION     CESAREAN SECTION WITH BILATERAL TUBAL LIGATION Bilateral 12/10/2015   Procedure: REPEAT CESAREAN SECTION WITH BILATERAL TUBAL LIGATION;  Surgeon: Lavina Hamman, MD;  Location: [facility] BIRTHING SU[facility];   Service: Obstetrics;  Laterality: Bilateral;   DILATION AND CURETTAGE OF UTERUS     kidney stones  May-June   to MAU 4x     OB History     Gravida  4   Para  3   Term  3   Preterm      AB  1   Living  1      SAB  1   IAB      Ectopic      Multiple  0   Live Births  1           Family History  Problem Relation Age of Onset   Alcohol abuse Neg Hx    Arthritis Neg Hx    Asthma Neg Hx    Birth defects Neg Hx    Cancer Neg Hx    COPD Neg Hx    Depression Neg Hx    Diabetes Neg Hx    Drug abuse Neg Hx    Early death Neg Hx    Hearing loss Neg Hx    Heart disease Neg Hx    Hyperlipidemia Neg Hx    Hypertension Neg Hx    Kidney disease Neg Hx    Learning disabilities Neg Hx    Mental illness Neg Hx    Mental retardation Neg Hx    Miscarriages / Stillbirths Neg Hx    Stroke Neg Hx    Vision loss Neg Hx    Varicose Veins Neg Hx  Social History   Tobacco Use   Smoking status: Former    Packs/day: 1.00    Types: Cigarettes   Smokeless tobacco: Former    Quit date: 04/17/2003  Substance Use Topics   Alcohol use: No   Drug use: No    Home Medications Prior to Admission medications   Not on File    Allergies    Black walnut pollen allergy skin test and Penicillins  Review of Systems   Review of Systems  Constitutional:  Negative for chills and fever.  HENT:  Negative for congestion and rhinorrhea.   Eyes:  Negative for redness and visual disturbance.  Respiratory:  Negative for shortness of breath and wheezing.   Cardiovascular:  Negative for chest pain and palpitations.  Gastrointestinal:  Negative for nausea and vomiting.  Genitourinary:  Positive for flank pain. Negative for dysuria and urgency.  Musculoskeletal:  Positive for back pain. Negative for arthralgias and myalgias.  Skin:  Negative for pallor and wound.  Neurological:  Negative for dizziness and headaches.   Physical Exam Updated Vital Signs BP 98/65   Pulse (!) 52    Temp 97.8 F (36.6 C) (Oral)   Resp 14   Ht 5\' 3"  (1.6 m)   Wt 63.5 kg   LMP 04/26/2021   SpO2 99%   BMI 24.80 kg/m   Physical Exam Vitals and nursing note reviewed.  Constitutional:      General: She is not in acute distress.    Appearance: She is well-developed. She is not diaphoretic.  HENT:     Head: Normocephalic and atraumatic.  Eyes:     Pupils: Pupils are equal, round, and reactive to light.  Cardiovascular:     Rate and Rhythm: Normal rate and regular rhythm.     Heart sounds: No murmur heard.   No friction rub. No gallop.  Pulmonary:     Effort: Pulmonary effort is normal.     Breath sounds: No wheezing or rales.  Abdominal:     General: There is no distension.     Palpations: Abdomen is soft.     Tenderness: There is no abdominal tenderness.  Musculoskeletal:        General: Tenderness present.     Cervical back: Normal range of motion and neck supple.     Comments: Pain with palpation for the low back diffusely worst to the R SI joint. PMS intact distally, reflexes 3+ and equal bilaterally.  No clonus.   Skin:    General: Skin is warm and dry.  Neurological:     Mental Status: She is alert and oriented to person, place, and time.  Psychiatric:        Behavior: Behavior normal.    ED Results / Procedures / Treatments   Labs (all labs ordered are listed, but only abnormal results are displayed) Labs Reviewed  URINALYSIS, ROUTINE W REFLEX MICROSCOPIC - Abnormal; Notable for the following components:      Result Value   APPearance HAZY (*)    Leukocytes,Ua TRACE (*)    Bacteria, UA RARE (*)    All other components within normal limits  COMPREHENSIVE METABOLIC PANEL - Abnormal; Notable for the following components:   CO2 19 (*)    Total Protein 6.2 (*)    AST 14 (*)    All other components within normal limits  CBC WITH DIFFERENTIAL/PLATELET - Abnormal; Notable for the following components:   Hemoglobin 15.2 (*)    HCT 46.4 (*)  All other  components within normal limits  I-STAT BETA HCG BLOOD, ED ([facility], [facility], [facility] ONLY)    EKG None  Radiology DG Lumbar Spine Complete  Result Date: 05/11/2021 CLINICAL DATA:  Low back pain EXAM: LUMBAR SPINE - COMPLETE 4+ VIEW COMPARISON:  None. FINDINGS: There is no evidence of lumbar spine fracture. Alignment is normal. Intervertebral disc spaces are maintained. IMPRESSION: Negative. Electronically Signed   By: Macy Mis M.D.   On: 05/11/2021 10:45    Procedures Procedures   Medications Ordered in ED Medications  acetaminophen (TYLENOL) tablet 650 mg (has no administration in time range)  ketorolac (TORADOL) 15 MG/ML injection 15 mg (has no administration in time range)  oxyCODONE (Oxy IR/ROXICODONE) immediate release tablet 5 mg (has no administration in time range)  diazepam (VALIUM) tablet 5 mg (has no administration in time range)  oxyCODONE-acetaminophen (PERCOCET/ROXICET) 5-325 MG per tablet 1 tablet (1 tablet Oral Given 05/11/21 [id number])    ED Course  I have reviewed the triage vital signs and the nursing notes.  Pertinent labs & imaging results that were available during my care of the patient were reviewed by me and considered in my medical decision making (see chart for details).    MDM Rules/Calculators/A&P                           40 yo F with a cc of R sided low back pain with radiation down the leg.  This been going on for about 3 days now.  Likely sciatica based on history and physical.  She was seen in the [facility] process and x-ray was ordered and urine and blood work.  UA negative for infection and contaminated as read by me.  Plain film of the L-spine viewed by me without fracture.  No red flags.  Will attempt to treat her pain here.  Have her follow-up with her family doctor.  1:39 PM:  I have discussed the diagnosis/risks/treatment options with the patient and believe the pt to be eligible for discharge home to follow-up with PCP. We also discussed returning to the ED  immediately if new or worsening sx occur. We discussed the sx which are most concerning (e.g., sudden worsening pain, fever, inability to tolerate by mouth) that necessitate immediate return. Medications administered to the patient during their visit and any new prescriptions provided to the patient are listed below.  Medications given during this visit Medications  acetaminophen (TYLENOL) tablet 650 mg (has no administration in time range)  ketorolac (TORADOL) 15 MG/ML injection 15 mg (has no administration in time range)  oxyCODONE (Oxy IR/ROXICODONE) immediate release tablet 5 mg (has no administration in time range)  diazepam (VALIUM) tablet 5 mg (has no administration in time range)  oxyCODONE-acetaminophen (PERCOCET/ROXICET) 5-325 MG per tablet 1 tablet (1 tablet Oral Given 05/11/21 0953)     The patient appears reasonably screen and/or stabilized for discharge and I doubt any other medical condition or other [facility] requiring further screening, evaluation, or treatment in the ED at this time prior to discharge.   Final Clinical Impression(s) / ED Diagnoses Final diagnoses:  Sciatica of right side    Rx / DC Orders ED Discharge Orders     None        Deno Etienne, DO 05/11/21 [place], [place], DO 05/11/21 1339

## 2021-05-11 NOTE — ED Provider Notes (Signed)
Emergency Medicine Provider Triage Evaluation Note  Tonya [name] , a 40 y.o. female  was evaluated in triage.  Pt complains of back pain.  She states that for 3 days she has had worsening right lower back pain that she describes as "excruciating."  She states that the pain intermittently shoots down her right leg.  Worse with standing or sitting.  She feels that she is unable to bear weight on the right leg without pain.  She also states that she has had cloudy urine.  She states that she has been admitted multiple times previously for pyelonephritis and pain is similar to this as well.  She denies hematuria or fevers.  Denies injuries at work.  Review of Systems  Positive: See above Negative:   Physical Exam  There were no vitals taken for this visit. Gen:   Awake, crying in exam room Resp:  Normal effort  MSK:   Moves extremities without difficulty  Other:  Patient is standing, unable to bear weight on the right leg without increasing pain.  Abdomen is soft and nontender.  Unable to reproduce pain with palpation of her low back.  No CVA tenderness bilaterally.  L-spine without any obvious step-offs or deformity, redness or swelling.  Medical Decision Making  Medically screening exam initiated at 9:33 AM.  Appropriate orders placed.  ZAMANTHA STREBEL was informed that the remainder of the evaluation will be completed by another provider, this initial triage assessment does not replace that evaluation, and the importance of remaining in the ED until their evaluation is complete.     Cristopher Peru, PA-C 05/11/21 0935    Melene Plan, DO 05/11/21 [phone]

## 2021-05-12 ENCOUNTER — Ambulatory Visit (HOSPITAL_COMMUNITY): Payer: Self-pay

## 2021-05-13 ENCOUNTER — Telehealth: Payer: Self-pay

## 2021-05-13 NOTE — Telephone Encounter (Signed)
Transition Care Management Unsuccessful Follow-up Telephone Call  Date of discharge and from where:  05/11/2021-[facility]   Attempts:  1st Attempt  Reason for unsuccessful TCM follow-up call:  Left voice message

## 2021-05-14 NOTE — Telephone Encounter (Signed)
Transition Care Management Follow-up Telephone Call Date of discharge and from where: 05/11/2021 from [facility] How have you been since you were released from the hospital? Pt stated that she is feeling better and did not have any questions at this time.  Any questions or concerns? No  Items Reviewed: Did the pt receive and understand the discharge instructions provided? Yes  Medications obtained and verified? Yes  Other? No  Any new allergies since your discharge? No  Dietary orders reviewed? No Do you have support at home? Yes   Functional Questionnaire: (I = Independent and D = Dependent) ADLs: I Bathing/Dressing- I Meal Prep- I Eating- I Maintaining continence- I Transferring/Ambulation- I Managing Meds- I   Follow up appointments reviewed: PCP Hospital f/u appt confirmed? No   Specialist Hospital f/u appt confirmed? No   Are transportation arrangements needed? No  If their condition worsens, is the pt aware to call PCP or go to the Emergency Dept.? Yes Was the patient provided with contact information for the PCP's office or ED? Yes Was to pt encouraged to call back with questions or concerns? Yes

## 2021-05-30 ENCOUNTER — Ambulatory Visit: Payer: Self-pay

## 2021-05-30 DIAGNOSIS — M545 Low back pain, unspecified: Secondary | ICD-10-CM | POA: Diagnosis not present

## 2022-06-03 ENCOUNTER — Emergency Department (HOSPITAL_COMMUNITY)
Admission: EM | Admit: 2022-06-03 | Discharge: 2022-06-04 | Discharge status: Against Medical Advice | Payer: Medicaid Other | Attending: Emergency Medicine | Admitting: Emergency Medicine

## 2022-06-03 DIAGNOSIS — N200 Calculus of kidney: Secondary | ICD-10-CM | POA: Diagnosis not present

## 2022-06-03 DIAGNOSIS — R109 Unspecified abdominal pain: Secondary | ICD-10-CM | POA: Diagnosis not present

## 2022-06-03 DIAGNOSIS — N12 Tubulo-interstitial nephritis, not specified as acute or chronic: Secondary | ICD-10-CM | POA: Diagnosis not present

## 2022-06-03 DIAGNOSIS — Z5321 Procedure and treatment not carried out due to patient leaving prior to being seen by health care provider: Secondary | ICD-10-CM | POA: Diagnosis not present

## 2022-06-03 DIAGNOSIS — R509 Fever, unspecified: Secondary | ICD-10-CM | POA: Diagnosis not present

## 2022-06-03 LAB — COMPREHENSIVE METABOLIC PANEL
ALT: 24 U/L (ref 0–44)
AST: 16 U/L (ref 15–41)
Albumin: 3.8 g/dL (ref 3.5–5.0)
Alkaline Phosphatase: 58 U/L (ref 38–126)
Anion gap: 11 (ref 5–15)
BUN: 9 mg/dL (ref 6–20)
CO2: 20 mmol/L — ABNORMAL LOW (ref 22–32)
Calcium: 8.9 mg/dL (ref 8.9–10.3)
Chloride: 105 mmol/L (ref 98–111)
Creatinine, Ser: 0.82 mg/dL (ref 0.44–1.00)
GFR, Estimated: >60 mL/min (ref >60)
Glucose, Bld: 102 mg/dL — ABNORMAL HIGH (ref 70–99)
Potassium: 3.3 mmol/L — ABNORMAL LOW (ref 3.5–5.1)
Sodium: 136 mmol/L (ref 135–145)
Total Bilirubin: 1.2 mg/dL (ref 0.3–1.2)
Total Protein: 6.9 g/dL (ref 6.5–8.1)

## 2022-06-03 LAB — CBC WITH DIFFERENTIAL/PLATELET
Abs Immature Granulocytes: 0.03 10*3/uL (ref 0.00–0.07)
Basophils Absolute: 0 10*3/uL (ref 0.0–0.1)
Basophils Relative: 0 %
Eosinophils Absolute: 0 10*3/uL (ref 0.0–0.5)
Eosinophils Relative: 0 %
HCT: 42.1 % (ref 36.0–46.0)
Hemoglobin: 14.6 g/dL (ref 12.0–15.0)
Immature Granulocytes: 0 %
Lymphocytes Relative: 5 %
Lymphs Abs: 0.4 10*3/uL — ABNORMAL LOW (ref 0.7–4.0)
MCH: 31.5 pg (ref 26.0–34.0)
MCHC: 34.7 g/dL (ref 30.0–36.0)
MCV: 90.7 fL (ref 80.0–100.0)
Monocytes Absolute: 0.6 10*3/uL (ref 0.1–1.0)
Monocytes Relative: 7 %
Neutro Abs: 7.5 10*3/uL (ref 1.7–7.7)
Neutrophils Relative %: 88 %
Platelets: 212 10*3/uL (ref 150–400)
RBC: 4.64 MIL/uL (ref 3.87–5.11)
RDW: 11.8 % (ref 11.5–15.5)
WBC: 8.6 10*3/uL (ref 4.0–10.5)
nRBC: 0 % (ref 0.0–0.2)

## 2022-06-03 LAB — URINALYSIS, ROUTINE W REFLEX MICROSCOPIC
Bilirubin Urine: NEGATIVE
Glucose, UA: NEGATIVE mg/dL
Ketones, ur: NEGATIVE mg/dL
Leukocytes,Ua: NEGATIVE
Nitrite: NEGATIVE
Protein, ur: NEGATIVE mg/dL
Specific Gravity, Urine: 1.015 (ref 1.005–1.030)
pH: 5 (ref 5.0–8.0)

## 2022-06-03 LAB — I-STAT BETA HCG BLOOD, ED (MC, WL, AP ONLY): I-stat hCG, quantitative: <5 m[IU]/mL (ref <5)

## 2022-06-03 LAB — LACTIC ACID, PLASMA: Lactic Acid, Venous: 1.5 mmol/L (ref 0.5–1.9)

## 2022-06-03 MED ORDER — ACETAMINOPHEN 325 MG PO TABS
650.0000 mg | ORAL_TABLET | Freq: Once | ORAL | Status: AC | PRN
  Administered 2022-06-03: 650 mg via ORAL
  Filled 2022-06-03: qty 2

## 2022-06-03 NOTE — ED Triage Notes (Signed)
Patient complains of left sided flank pain that started several days ago, reports history of multiple hospitalizations for kidney infections over the last several years. Patient is alert, oriented, and in no apparent distress at this time.

## 2022-06-03 NOTE — ED Notes (Signed)
Writer accidentally clicked off the second lactic

## 2022-06-04 ENCOUNTER — Other Ambulatory Visit: Payer: Self-pay

## 2022-06-04 ENCOUNTER — Emergency Department (HOSPITAL_COMMUNITY)
Admission: EM | Admit: 2022-06-04 | Discharge: 2022-06-04 | Disposition: A | Discharge status: Home / Self Care | Payer: Medicaid Other | Source: Home / Self Care | Attending: Emergency Medicine | Admitting: Emergency Medicine

## 2022-06-04 ENCOUNTER — Emergency Department (HOSPITAL_COMMUNITY): Payer: Medicaid Other

## 2022-06-04 DIAGNOSIS — N12 Tubulo-interstitial nephritis, not specified as acute or chronic: Secondary | ICD-10-CM

## 2022-06-04 DIAGNOSIS — N200 Calculus of kidney: Secondary | ICD-10-CM | POA: Diagnosis not present

## 2022-06-04 DIAGNOSIS — R509 Fever, unspecified: Secondary | ICD-10-CM | POA: Diagnosis not present

## 2022-06-04 LAB — URINALYSIS, ROUTINE W REFLEX MICROSCOPIC
Bilirubin Urine: NEGATIVE
Glucose, UA: NEGATIVE mg/dL
Ketones, ur: NEGATIVE mg/dL
Nitrite: POSITIVE — AB
Protein, ur: 100 mg/dL — AB
RBC / HPF: >50 RBC/hpf — ABNORMAL HIGH (ref 0–5)
Specific Gravity, Urine: 1.019 (ref 1.005–1.030)
pH: 6 (ref 5.0–8.0)

## 2022-06-04 LAB — CBC WITH DIFFERENTIAL/PLATELET
Abs Immature Granulocytes: 0.05 10*3/uL (ref 0.00–0.07)
Basophils Absolute: 0 10*3/uL (ref 0.0–0.1)
Basophils Relative: 0 %
Eosinophils Absolute: 0.1 10*3/uL (ref 0.0–0.5)
Eosinophils Relative: 1 %
HCT: 48 % — ABNORMAL HIGH (ref 36.0–46.0)
Hemoglobin: 15.4 g/dL — ABNORMAL HIGH (ref 12.0–15.0)
Immature Granulocytes: 1 %
Lymphocytes Relative: 12 %
Lymphs Abs: 0.9 10*3/uL (ref 0.7–4.0)
MCH: 31.2 pg (ref 26.0–34.0)
MCHC: 32.1 g/dL (ref 30.0–36.0)
MCV: 97.4 fL (ref 80.0–100.0)
Monocytes Absolute: 0.7 10*3/uL (ref 0.1–1.0)
Monocytes Relative: 9 %
Neutro Abs: 6.1 10*3/uL (ref 1.7–7.7)
Neutrophils Relative %: 77 %
Platelets: 212 10*3/uL (ref 150–400)
RBC: 4.93 MIL/uL (ref 3.87–5.11)
RDW: 11.9 % (ref 11.5–15.5)
WBC: 7.9 10*3/uL (ref 4.0–10.5)
nRBC: 0 % (ref 0.0–0.2)

## 2022-06-04 LAB — BASIC METABOLIC PANEL
Anion gap: 11 (ref 5–15)
BUN: 13 mg/dL (ref 6–20)
CO2: 22 mmol/L (ref 22–32)
Calcium: 9 mg/dL (ref 8.9–10.3)
Chloride: 106 mmol/L (ref 98–111)
Creatinine, Ser: 0.87 mg/dL (ref 0.44–1.00)
GFR, Estimated: >60 mL/min (ref >60)
Glucose, Bld: 86 mg/dL (ref 70–99)
Potassium: 4 mmol/L (ref 3.5–5.1)
Sodium: 139 mmol/L (ref 135–145)

## 2022-06-04 LAB — PREGNANCY, URINE: Preg Test, Ur: NEGATIVE

## 2022-06-04 MED ORDER — CEFTRIAXONE SODIUM 1 G IJ SOLR
1.0000 g | Freq: Once | INTRAMUSCULAR | Status: AC
  Administered 2022-06-04: 1 g via INTRAVENOUS
  Filled 2022-06-04: qty 10

## 2022-06-04 MED ORDER — HYDROCODONE-ACETAMINOPHEN 5-325 MG PO TABS
1.0000 | ORAL_TABLET | Freq: Four times a day (QID) | ORAL | 0 refills | Status: DC | PRN
Start: 2022-06-04 — End: 2022-07-23

## 2022-06-04 MED ORDER — ONDANSETRON HCL 4 MG PO TABS: 4.0000 mg | ORAL_TABLET | Freq: Four times a day (QID) | ORAL | 0 refills | Status: AC

## 2022-06-04 MED ORDER — MORPHINE SULFATE (PF) 4 MG/ML IV SOLN
4.0000 mg | Freq: Once | INTRAVENOUS | Status: AC
  Administered 2022-06-04: 4 mg via INTRAVENOUS
  Filled 2022-06-04: qty 1

## 2022-06-04 MED ORDER — IBUPROFEN 800 MG PO TABS: 800.0000 mg | ORAL_TABLET | Freq: Three times a day (TID) | ORAL | 0 refills | Status: AC

## 2022-06-04 MED ORDER — ACETAMINOPHEN 325 MG PO TABS
650.0000 mg | ORAL_TABLET | Freq: Once | ORAL | Status: AC
  Administered 2022-06-04: 650 mg via ORAL
  Filled 2022-06-04: qty 2

## 2022-06-04 MED ORDER — CIPROFLOXACIN HCL 500 MG PO TABS: 500.0000 mg | ORAL_TABLET | Freq: Two times a day (BID) | ORAL | 0 refills | Status: AC

## 2022-06-04 NOTE — ED Notes (Signed)
Pt called multiple times no answer 

## 2022-06-04 NOTE — Discharge Instructions (Signed)
You were found to have a kidney infection today.  Your blood counts are normal and your kidney function is also within normal limits.  I have sent the following medications to your pharmacy:  Ciprofloxacin is the antibiotic you should take for the next 7 days. Ondansetron is at your pharmacy for nausea as needed Ibuprofen should be taken for body aches and fevers Hydrocodone/acetaminophen.  This is also known as Vicodin.  It is a controlled substance to keep it out of the reach of others.  Do not drive or operate machinery on this either.  Do not hesitate to return to the emergency department with any worsening symptoms.  Otherwise, you may follow-up outpatient.  You mentioned that you do not have a PCP so there are multiple offices on these papers for you to call to get established.  It was a pleasure to meet you and we hope you feel better!

## 2022-06-04 NOTE — ED Triage Notes (Signed)
Pt c/o left sided flank pain x several days. Pt was seen at different hospital yesterday. Pt reports fever today.

## 2022-06-04 NOTE — ED Provider Notes (Signed)
[facility]-EMERGENCY DEPT Provider Note   CSN: [id number] Arrival date & time: 06/04/22  1005     History  Chief Complaint  Patient presents with   Flank Pain    Tonya [name] is a 42 y.o. female with a past medical history of pyelonephritis presenting today with flank pain.  Localizes the pain to her left flank and says it has been going on for the past week.  Has developed dysuria, fever, chills, nausea and vomiting over the past 24 hours.  She says that she went to [facility] yesterday and no imaging was performed and she left without being seen.  Now she feels much worse.  History of pyelonephritis and this feels similar.  Has had a hysterectomy, no concern for pregnancy.   Flank Pain       Home Medications Prior to Admission medications   Not on File      Allergies    Black walnut pollen allergy skin test and Penicillins    Review of Systems   Review of Systems  Genitourinary:  Positive for flank pain.    Physical Exam Updated Vital Signs BP 99/76   Pulse 93   Temp 98.3 F (36.8 C) (Oral)   Resp 17   Ht 5\' 3"  (1.6 m)   Wt 64 kg   SpO2 97%   BMI 24.99 kg/m  Physical Exam Vitals and nursing note reviewed.  Constitutional:      Appearance: Normal appearance.  HENT:     Head: Normocephalic and atraumatic.  Eyes:     General: No scleral icterus.    Conjunctiva/sclera: Conjunctivae normal.  Cardiovascular:     Rate and Rhythm: Normal rate and regular rhythm.  Pulmonary:     Effort: Pulmonary effort is normal. No respiratory distress.  Abdominal:     General: Abdomen is flat.     Palpations: Abdomen is soft.     Tenderness: There is no abdominal tenderness. There is left CVA tenderness.  Skin:    General: Skin is warm and dry.     Findings: No rash.  Neurological:     Mental Status: She is alert.  Psychiatric:        Mood and Affect: Mood normal.     ED Results / Procedures / Treatments   Labs (all labs ordered are  listed, but only abnormal results are displayed) Labs Reviewed  CBC WITH DIFFERENTIAL/PLATELET - Abnormal; Notable for the following components:      Result Value   Hemoglobin 15.4 (*)    HCT 48.0 (*)    All other components within normal limits  URINALYSIS, ROUTINE W REFLEX MICROSCOPIC - Abnormal; Notable for the following components:   Color, Urine AMBER (*)    APPearance HAZY (*)    Hgb urine dipstick MODERATE (*)    Protein, ur 100 (*)    Nitrite POSITIVE (*)    Leukocytes,Ua SMALL (*)    RBC / HPF >50 (*)    Bacteria, UA MANY (*)    All other components within normal limits  URINE CULTURE  BASIC METABOLIC PANEL  PREGNANCY, URINE    EKG None  Radiology CT Renal Stone Study  Result Date: 06/04/2022 CLINICAL DATA:  Left flank pain over the last 3-4 days.  Fever. EXAM: CT ABDOMEN AND PELVIS WITHOUT CONTRAST TECHNIQUE: Multidetector CT imaging of the abdomen and pelvis was performed following the standard protocol without IV contrast. RADIATION DOSE REDUCTION: This exam was performed according to the departmental  dose-optimization program which includes automated exposure control, adjustment of the mA and/or kV according to patient size and/or use of iterative reconstruction technique. COMPARISON:  04/28/2016 FINDINGS: Lower chest: Unremarkable Hepatobiliary: Unremarkable Pancreas: Unremarkable Spleen: Unremarkable Adrenals/Urinary Tract: Both adrenal glands appear normal. 4 mm in long axis right kidney lower pole nonobstructive renal calculus. Abnormal left perirenal stranding especially along the renal hilum and left kidney upper pole without observed left nephrolithiasis. Urinary bladder and adrenal glands unremarkable. Stomach/Bowel: Unremarkable.  Normal appendix. Vascular/Lymphatic: Left periaortic lymph nodes at the level of the left kidney measuring up to 0.8 cm in diameter. These may be reactive. Reproductive: Unremarkable Other: No supplemental non-categorized findings.  Musculoskeletal: Unremarkable IMPRESSION: 1. Abnormal left perirenal stranding especially along the renal hilum and left kidney upper pole, with borderline prominent left periaortic lymph nodes at the level of the kidney. The appearance raises suspicion for an inflammatory process such as pyelonephritis, correlate with urine analysis. 2. 4 mm nonobstructive right kidney lower pole calculus. Electronically Signed   By: Gaylyn Rong M.D.   On: 06/04/2022 13:49    Procedures Procedures   Medications Ordered in ED Medications  cefTRIAXone (ROCEPHIN) 1 g in sodium chloride 0.9 % 100 mL IVPB (has no administration in time range)  acetaminophen (TYLENOL) tablet 650 mg (has no administration in time range)  morphine (PF) 4 MG/ML injection 4 mg (has no administration in time range)    ED Course/ Medical Decision Making/ A&P                           Medical Decision Making Amount and/or Complexity of Data Reviewed Labs: ordered.  Risk OTC drugs. Prescription drug management.   42 year old female with flank pain.  Differential includes but is not limited to nephrolithiasis, pyelonephritis, renal colic, constipation, ovarian cyst/torsion, pregnancy.  This is not an exhaustive differential.    Past Medical History / Co-morbidities / Social History: Pyelonephritis noted in 2017, recurrent nephrolithiasis as well   Additional history: Left without being seen from [facility] yesterday.  At that time urinalysis did not suggest pyelonephritis.  Lab work stable from yesterday.  Additionally, patient was admitted for pyelonephritis in 2017.  She received daily ceftriaxone without any allergic reaction.  She was also discharged with ciprofloxacin without complications.   Physical Exam: Febrile and with left flank tenderness  Lab Tests: I ordered, and personally interpreted labs.  The pertinent results include: Nitrite and leukocyte positive bacteruria    Imaging Studies: CT renal suspicious for  pyelonephritis    Medications: Morphine, Tylenol and Rocephin.  Patient had a hives allergy listed in her chart however in 2017 she was treated with Rocephin multiple times with no noted reaction.    MDM/Disposition: This is a 42 year old female who presented today with dysuria, fever, chills and feeling like she had a recurring kidney infection.  Urinalysis and CT renal align with pyelonephritis.  She was given Tylenol for her fever.  Has never been tachycardic and does not have a leukocytosis.  She is tolerating p.o. intake.  This is somebody without any comorbidities and I believe she is stable to be treated as an outpatient pyelonephritis.  She is agreeable to this.  Admission was offered however she says that if it is safe she is agreeable to going home.  Tolerating p.o. intake.  Feels better after morphine, Tylenol and Rocephin.  Will send her a few pills of Vicodin, ibuprofen, Zofran as needed and continued outpatient antibiotics.  Final Clinical Impression(s) / ED Diagnoses Final diagnoses:  Pyelonephritis    Rx / DC Orders ED Discharge Orders          Ordered    ciprofloxacin (CIPRO) 500 MG tablet  Every 12 hours        06/04/22 2031    ondansetron (ZOFRAN) 4 MG tablet  Every 6 hours        06/04/22 2031    HYDROcodone-acetaminophen (NORCO/VICODIN) 5-325 MG tablet  Every 6 hours PRN        06/04/22 2031    ibuprofen (ADVIL) 800 MG tablet  3 times daily        06/04/22 2031           Results and diagnoses were explained to the patient. Return precautions discussed in full. Patient had no additional questions and expressed complete understanding.   This chart was dictated using voice recognition software.  Despite best efforts to proofread,  errors can occur which can change the documentation meaning.     Woodroe Chen 06/04/22 2116    Linwood Dibbles, MD 06/05/22 [phone]

## 2022-06-04 NOTE — ED Provider Triage Note (Signed)
Emergency Medicine Provider Triage Evaluation Note  Tonya [name] , a 42 y.o. female  was evaluated in triage.  Pt complains of left flank pain x 3 to 4 days.  Having fevers at home.  Denies any dysuria but does endorse hematuria.  Seen Today, left before being seen in the back due to wait times.  History of previous stones, this feels similar..  Review of Systems  Per HPI  Physical Exam  There were no vitals taken for this visit. Gen:   Awake, no distress   Resp:  Normal effort  MSK:   Moves extremities without difficulty  Other:  Left CVA tenderness on exam  Medical Decision Making  Medically screening exam initiated at 11:07 AM.  Appropriate orders placed.  VONNA BRABSON was informed that the remainder of the evaluation will be completed by another provider, this initial triage assessment does not replace that evaluation, and the importance of remaining in the ED until their evaluation is complete.     Sherrill Raring, PA-C 06/04/22 [phone]

## 2022-06-06 LAB — URINE CULTURE: Culture: >=100000 — AB

## 2022-06-07 ENCOUNTER — Telehealth (HOSPITAL_BASED_OUTPATIENT_CLINIC_OR_DEPARTMENT_OTHER): Payer: Self-pay | Admitting: *Deleted

## 2022-06-07 NOTE — Telephone Encounter (Signed)
Post ED Visit - Positive Culture Follow-up  Culture report reviewed by antimicrobial stewardship pharmacist: [facility] Team []  Elenor Quinones, Pharm.D. []  Heide Guile, Pharm.D., BCPS AQ-ID []  Parks Neptune, Pharm.D., BCPS []  Alycia Rossetti, Pharm.D., BCPS []  [place], Pharm.D., BCPS, AAHIVP []  Legrand Como, Pharm.D., BCPS, AAHIVP []  Salome Arnt, PharmD, BCPS []  Johnnette Gourd, PharmD, BCPS []  Hughes Better, PharmD, BCPS []  Leeroy Cha, PharmD []  Laqueta Linden, PharmD, BCPS []  Albertina Parr, PharmD  [facility] Team []  Leodis Sias, PharmD []  Lindell Spar, PharmD []  Royetta Asal, PharmD []  Graylin Shiver, Rph []  Rema Fendt) Glennon Mac, PharmD []  Arlyn Dunning, PharmD []  Netta Cedars, PharmD []  Dia Sitter, PharmD []  Leone Haven, PharmD [x]  Gretta Arab, PharmD []  Theodis Shove, PharmD []  Peggyann Juba, PharmD []  Reuel Boom, PharmD   Positive urine culture Treated with Ciprofloxacin, organism sensitive to the same and no further patient follow-up is required at this time.  Rosie Fate 06/07/2022, 12:41 PM

## 2022-06-13 ENCOUNTER — Telehealth: Payer: Self-pay

## 2022-06-13 NOTE — Telephone Encounter (Signed)
Mychart msg sent. AS, CMA 

## 2022-07-23 ENCOUNTER — Encounter (HOSPITAL_BASED_OUTPATIENT_CLINIC_OR_DEPARTMENT_OTHER): Payer: Self-pay | Admitting: Emergency Medicine

## 2022-07-23 ENCOUNTER — Emergency Department (HOSPITAL_BASED_OUTPATIENT_CLINIC_OR_DEPARTMENT_OTHER): Payer: Medicaid Other

## 2022-07-23 ENCOUNTER — Emergency Department (HOSPITAL_BASED_OUTPATIENT_CLINIC_OR_DEPARTMENT_OTHER)
Admission: EM | Admit: 2022-07-23 | Discharge: 2022-07-23 | Disposition: A | Discharge status: Home / Self Care | Payer: Medicaid Other | Attending: Emergency Medicine | Admitting: Emergency Medicine

## 2022-07-23 ENCOUNTER — Other Ambulatory Visit: Payer: Self-pay

## 2022-07-23 DIAGNOSIS — S022XXA Fracture of nasal bones, initial encounter for closed fracture: Secondary | ICD-10-CM

## 2022-07-23 DIAGNOSIS — J340 Abscess, furuncle and carbuncle of nose: Secondary | ICD-10-CM | POA: Insufficient documentation

## 2022-07-23 DIAGNOSIS — S00502A Unspecified superficial injury of oral cavity, initial encounter: Secondary | ICD-10-CM | POA: Diagnosis not present

## 2022-07-23 DIAGNOSIS — J342 Deviated nasal septum: Secondary | ICD-10-CM | POA: Diagnosis not present

## 2022-07-23 DIAGNOSIS — S0993XA Unspecified injury of face, initial encounter: Secondary | ICD-10-CM

## 2022-07-23 DIAGNOSIS — L03211 Cellulitis of face: Secondary | ICD-10-CM | POA: Diagnosis not present

## 2022-07-23 MED ORDER — OXYCODONE-ACETAMINOPHEN 5-325 MG PO TABS: 1.0000 | ORAL_TABLET | Freq: Four times a day (QID) | ORAL | 0 refills | Status: DC | PRN

## 2022-07-23 MED ORDER — DOXYCYCLINE HYCLATE 100 MG PO TABS
100.0000 mg | ORAL_TABLET | Freq: Once | ORAL | Status: AC
  Administered 2022-07-23: 100 mg via ORAL
  Filled 2022-07-23: qty 1

## 2022-07-23 MED ORDER — DOXYCYCLINE HYCLATE 100 MG PO CAPS
100.0000 mg | ORAL_CAPSULE | Freq: Two times a day (BID) | ORAL | 0 refills | Status: AC
Start: 2022-07-23 — End: ?

## 2022-07-23 MED ORDER — OXYCODONE-ACETAMINOPHEN 5-325 MG PO TABS
1.0000 | ORAL_TABLET | Freq: Once | ORAL | Status: AC
  Administered 2022-07-23: 1 via ORAL
  Filled 2022-07-23: qty 1

## 2022-07-23 NOTE — Discharge Instructions (Signed)
Please read and follow all provided instructions.  Your diagnoses today include:  1. Closed fracture of nasal bone, initial encounter   2. Cellulitis of nose, external   3. Nasal septal deviation     Tests performed today include: CT scan of your face: Demonstrated dental and nasal bone fracture Vital signs. See below for your results today.   Medications prescribed:  Doxycycline - antibiotic  You have been prescribed an antibiotic medicine: take the entire course of medicine even if you are feeling better. Stopping early can cause the antibiotic not to work.  Percocet (oxycodone/acetaminophen) - narcotic pain medication  DO NOT drive or perform any activities that require you to be awake and alert because this medicine can make you drowsy. BE VERY CAREFUL not to take multiple medicines containing Tylenol (also called acetaminophen). Doing so can lead to an overdose which can damage your liver and cause liver failure and possibly death.  Take any prescribed medications only as directed.  Home care instructions:  Follow any educational materials contained in this packet.  BE VERY CAREFUL not to take multiple medicines containing Tylenol (also called acetaminophen). Doing so can lead to an overdose which can damage your liver and cause liver failure and possibly death.   Follow-up instructions: Please follow-up with the ENT in the next 1 week for recheck of cellulitis and your injury.  Return instructions:  Return with worsening pain, swelling, redness, drainage from the face or if you develop a fever  Your vital signs today were: BP 110/70   Pulse 80   Temp 98.2 F (36.8 C) (Oral)   Resp 16   LMP 07/21/2022   SpO2 100%  If your blood pressure (BP) was elevated above 135/85 this visit, please have this repeated by your doctor within one month. --------------
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
## 2022-08-27 DIAGNOSIS — R0989 Other specified symptoms and signs involving the circulatory and respiratory systems: Secondary | ICD-10-CM | POA: Diagnosis not present

## 2022-08-27 DIAGNOSIS — R059 Cough, unspecified: Secondary | ICD-10-CM | POA: Diagnosis not present

## 2022-08-27 DIAGNOSIS — R0981 Nasal congestion: Secondary | ICD-10-CM | POA: Diagnosis not present

## 2022-10-09 ENCOUNTER — Other Ambulatory Visit: Payer: Self-pay

## 2022-10-09 ENCOUNTER — Emergency Department (HOSPITAL_BASED_OUTPATIENT_CLINIC_OR_DEPARTMENT_OTHER): Payer: Medicaid Other

## 2022-10-09 ENCOUNTER — Emergency Department (HOSPITAL_BASED_OUTPATIENT_CLINIC_OR_DEPARTMENT_OTHER)
Admission: EM | Admit: 2022-10-09 | Discharge: 2022-10-09 | Disposition: A | Discharge status: Home / Self Care | Payer: Medicaid Other | Attending: Emergency Medicine | Admitting: Emergency Medicine

## 2022-10-09 ENCOUNTER — Encounter (HOSPITAL_BASED_OUTPATIENT_CLINIC_OR_DEPARTMENT_OTHER): Payer: Self-pay

## 2022-10-09 DIAGNOSIS — R519 Headache, unspecified: Secondary | ICD-10-CM | POA: Diagnosis not present

## 2022-10-09 DIAGNOSIS — R040 Epistaxis: Secondary | ICD-10-CM | POA: Diagnosis not present

## 2022-10-09 DIAGNOSIS — T1490XA Injury, unspecified, initial encounter: Secondary | ICD-10-CM | POA: Diagnosis not present

## 2022-10-09 DIAGNOSIS — S0993XA Unspecified injury of face, initial encounter: Secondary | ICD-10-CM | POA: Insufficient documentation

## 2022-10-09 MED ORDER — ACETAMINOPHEN 500 MG PO TABS
1000.0000 mg | ORAL_TABLET | Freq: Once | ORAL | Status: AC
  Administered 2022-10-09: 1000 mg via ORAL
  Filled 2022-10-09: qty 2
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
## 2022-11-14 ENCOUNTER — Emergency Department (HOSPITAL_BASED_OUTPATIENT_CLINIC_OR_DEPARTMENT_OTHER)
Admission: EM | Admit: 2022-11-14 | Discharge: 2022-11-14 | Disposition: A | Discharge status: Home / Self Care | Payer: Medicaid Other | Attending: Emergency Medicine | Admitting: Emergency Medicine

## 2022-11-14 ENCOUNTER — Encounter (HOSPITAL_BASED_OUTPATIENT_CLINIC_OR_DEPARTMENT_OTHER): Payer: Self-pay | Admitting: Emergency Medicine

## 2022-11-14 ENCOUNTER — Other Ambulatory Visit: Payer: Self-pay

## 2022-11-14 DIAGNOSIS — R1031 Right lower quadrant pain: Secondary | ICD-10-CM | POA: Diagnosis not present

## 2022-11-14 DIAGNOSIS — Z87891 Personal history of nicotine dependence: Secondary | ICD-10-CM | POA: Insufficient documentation

## 2022-11-14 LAB — CBC WITH DIFFERENTIAL/PLATELET
Abs Immature Granulocytes: 0.01 10*3/uL (ref 0.00–0.07)
Basophils Absolute: 0 10*3/uL (ref 0.0–0.1)
Basophils Relative: 0 %
Eosinophils Absolute: 0.1 10*3/uL (ref 0.0–0.5)
Eosinophils Relative: 2 %
HCT: 44.3 % (ref 36.0–46.0)
Hemoglobin: 15.3 g/dL — ABNORMAL HIGH (ref 12.0–15.0)
Immature Granulocytes: 0 %
Lymphocytes Relative: 23 %
Lymphs Abs: 1.4 10*3/uL (ref 0.7–4.0)
MCH: 31.4 pg (ref 26.0–34.0)
MCHC: 34.5 g/dL (ref 30.0–36.0)
MCV: 90.8 fL (ref 80.0–100.0)
Monocytes Absolute: 0.5 10*3/uL (ref 0.1–1.0)
Monocytes Relative: 8 %
Neutro Abs: 4.3 10*3/uL (ref 1.7–7.7)
Neutrophils Relative %: 67 %
Platelets: 236 10*3/uL (ref 150–400)
RBC: 4.88 MIL/uL (ref 3.87–5.11)
RDW: 13.1 % (ref 11.5–15.5)
WBC: 6.4 10*3/uL (ref 4.0–10.5)
nRBC: 0 % (ref 0.0–0.2)

## 2022-11-14 LAB — PREGNANCY, URINE: Preg Test, Ur: NEGATIVE

## 2022-11-14 LAB — URINALYSIS, ROUTINE W REFLEX MICROSCOPIC
Bacteria, UA: NONE SEEN
Bilirubin Urine: NEGATIVE
Glucose, UA: NEGATIVE mg/dL
Ketones, ur: >80 mg/dL — AB
Nitrite: NEGATIVE
Protein, ur: 30 mg/dL — AB
Specific Gravity, Urine: 1.036 — ABNORMAL HIGH (ref 1.005–1.030)
pH: 6 (ref 5.0–8.0)

## 2022-11-14 LAB — COMPREHENSIVE METABOLIC PANEL
ALT: 12 U/L (ref 0–44)
AST: 11 U/L — ABNORMAL LOW (ref 15–41)
Albumin: 4.7 g/dL (ref 3.5–5.0)
Alkaline Phosphatase: 45 U/L (ref 38–126)
Anion gap: 9 (ref 5–15)
BUN: 14 mg/dL (ref 6–20)
CO2: 23 mmol/L (ref 22–32)
Calcium: 9.7 mg/dL (ref 8.9–10.3)
Chloride: 105 mmol/L (ref 98–111)
Creatinine, Ser: 0.74 mg/dL (ref 0.44–1.00)
GFR, Estimated: >60 mL/min (ref >60)
Glucose, Bld: 116 mg/dL — ABNORMAL HIGH (ref 70–99)
Potassium: 3.3 mmol/L — ABNORMAL LOW (ref 3.5–5.1)
Sodium: 137 mmol/L (ref 135–145)
Total Bilirubin: 1.8 mg/dL — ABNORMAL HIGH (ref 0.3–1.2)
Total Protein: 7 g/dL (ref 6.5–8.1)

## 2022-11-14 MED ORDER — SODIUM CHLORIDE 0.9 % IV SOLN: INTRAVENOUS | Status: DC

## 2022-11-14 MED ORDER — HYDROMORPHONE HCL 1 MG/ML IJ SOLN: 1.0000 mg | Freq: Once | INTRAMUSCULAR | Status: DC

## 2022-11-14 MED ORDER — ONDANSETRON HCL 4 MG/2ML IJ SOLN: 4.0000 mg | Freq: Once | INTRAMUSCULAR | Status: DC

## 2022-11-14 NOTE — ED Provider Notes (Signed)
[place] EMERGENCY DEPARTMENT AT [facility] Provider Note   CSN: [id number] Arrival date & time: 11/14/22  1614     History  Chief Complaint  Patient presents with   Back Pain    Tonya [name] is a 42 y.o. female.  Patient with a complaint of some right-sided back pain right flank pain and now mostly right lower quadrant pain.  Patient told me that has been present for 2 days.  Told triage it was 4 days.  Associated with nausea but no vomiting.  Patient has a history of kidney stones but says this does not really remind her of that patient says there was a low-grade fever yesterday at home thought that may be her urine was infected.  No fevers here Temp 97.1 blood pressure was good at 110/87 not tachycardic heart rate was 83 past medical history significant for heart palpitations and kidney stones complicating pregnancy patient's former smoker quit in 2004.       Home Medications Prior to Admission medications   Medication Sig Start Date End Date Taking? Authorizing Provider  doxycycline (VIBRAMYCIN) 100 MG capsule Take 1 capsule (100 mg total) by mouth 2 (two) times daily. 07/23/22   Renne Crigler, PA-C  ibuprofen (ADVIL) 800 MG tablet Take 1 tablet (800 mg total) by mouth 3 (three) times daily. 06/04/22   Redwine, Madison A, PA-C  ondansetron (ZOFRAN) 4 MG tablet Take 1 tablet (4 mg total) by mouth every 6 (six) hours. 06/04/22   Redwine, Madison A, PA-C  oxyCODONE-acetaminophen (PERCOCET/ROXICET) 5-325 MG tablet Take 1 tablet by mouth every 6 (six) hours as needed for severe pain. 07/23/22   Renne Crigler, PA-C      Allergies    Black walnut pollen allergy skin test and Penicillins    Review of Systems   Review of Systems  Constitutional:  Negative for chills and fever.  HENT:  Negative for ear pain and sore throat.   Eyes:  Negative for pain and visual disturbance.  Respiratory:  Negative for cough and shortness of breath.   Cardiovascular:  Negative for chest  pain and palpitations.  Gastrointestinal:  Positive for abdominal pain and nausea. Negative for vomiting.  Genitourinary:  Positive for flank pain. Negative for dysuria and hematuria.  Musculoskeletal:  Positive for back pain. Negative for arthralgias.  Skin:  Negative for color change and rash.  Neurological:  Negative for seizures and syncope.  All other systems reviewed and are negative.   Physical Exam Updated Vital Signs BP 117/81 (BP Location: Right Arm)   Pulse 62   Temp (!) 97.1 F (36.2 C)   Resp 20   Ht 1.6 m (5\' 3" )   Wt 63.5 kg   SpO2 100%   BMI 24.80 kg/m  Physical Exam Vitals and nursing note reviewed.  Constitutional:      General: She is not in acute distress.    Appearance: Normal appearance. She is well-developed.  HENT:     Head: Normocephalic and atraumatic.  Eyes:     Extraocular Movements: Extraocular movements intact.     Conjunctiva/sclera: Conjunctivae normal.     Pupils: Pupils are equal, round, and reactive to light.  Cardiovascular:     Rate and Rhythm: Normal rate and regular rhythm.     Heart sounds: No murmur heard. Pulmonary:     Effort: Pulmonary effort is normal. No respiratory distress.     Breath sounds: Normal breath sounds.  Abdominal:     Palpations: Abdomen is soft.  Tenderness: There is abdominal tenderness. There is guarding.     Comments: Marked tenderness right lower quadrant of the abdomen.  With guarding  Musculoskeletal:        General: No swelling.     Cervical back: Normal range of motion and neck supple.  Skin:    General: Skin is warm and dry.     Capillary Refill: Capillary refill takes less than 2 seconds.  Neurological:     General: No focal deficit present.     Mental Status: She is alert and oriented to person, place, and time.     Cranial Nerves: No cranial nerve deficit.     Sensory: No sensory deficit.     Motor: No weakness.  Psychiatric:        Mood and Affect: Mood normal.     ED Results /  Procedures / Treatments   Labs (all labs ordered are listed, but only abnormal results are displayed) Labs Reviewed  COMPREHENSIVE METABOLIC PANEL - Abnormal; Notable for the following components:      Result Value   Potassium 3.3 (*)    Glucose, Bld 116 (*)    AST 11 (*)    Total Bilirubin 1.8 (*)    All other components within normal limits  URINALYSIS, ROUTINE W REFLEX MICROSCOPIC - Abnormal; Notable for the following components:   Specific Gravity, Urine 1.036 (*)    Hgb urine dipstick SMALL (*)    Ketones, ur >80 (*)    Protein, ur 30 (*)    Leukocytes,Ua MODERATE (*)    All other components within normal limits  CBC WITH DIFFERENTIAL/PLATELET - Abnormal; Notable for the following components:   Hemoglobin 15.3 (*)    All other components within normal limits  URINE CULTURE  PREGNANCY, URINE    EKG None  Radiology No results found.  Procedures Procedures    Medications Ordered in ED Medications  0.9 %  sodium chloride infusion (has no administration in time range)  HYDROmorphone (DILAUDID) injection 1 mg (has no administration in time range)  ondansetron (ZOFRAN) injection 4 mg (has no administration in time range)    ED Course/ Medical Decision Making/ A&P                             Medical Decision Making Amount and/or Complexity of Data Reviewed Labs: ordered.  Risk Prescription drug management.   Patient ended up leaving AMA claims she had to go home because of childcare things.  Clinically I made her aware that I was kind of concerned about acute appendicitis.  And therefore CT scan of the abdomen was critical.  Patient's complete metabolic panel significant for potassium of 3.3 total bili 1.8 renal function normal blood sugar 116.  Urinalysis not classic for urinary tract infection but did have 6-10 RBCs and 21-50 whites but did not really have any bacteria but had a lot of Thiele cells so could be contamination.  Urine was sent for culture.  CBC  white count 6.4 hemoglobin 15.3 platelets are 236.  Pregnancy test negative.   Final Clinical Impression(s) / ED Diagnoses Final diagnoses:  Right lower quadrant abdominal pain    Rx / DC Orders ED Discharge Orders     None         Vanetta Mulders, MD 11/14/22 2016

## 2022-11-14 NOTE — ED Notes (Signed)
Pt states she has to leave due to childcare issues. Pt was explained multiple times risks of leaving and not getting testing and treatment recommended by EDP. Pt states she understanding risks of leaving and that leaving is against medical advice. Pt signed AMA and left in no obvious distress.

## 2022-11-14 NOTE — ED Triage Notes (Addendum)
Pt presents to ED POV. Pt c/o back pain that radiates toward RLQ abd x4d. Pt also c/o vomiting, frequent urination, malodorous urine, low grade fever yesterday at home. hx of UTI and kidney stones

## 2022-11-16 LAB — URINE CULTURE: Culture: >=100000 — AB

## 2022-11-17 ENCOUNTER — Emergency Department (HOSPITAL_BASED_OUTPATIENT_CLINIC_OR_DEPARTMENT_OTHER): Payer: Medicaid Other

## 2022-11-17 ENCOUNTER — Other Ambulatory Visit: Payer: Self-pay

## 2022-11-17 ENCOUNTER — Encounter (HOSPITAL_BASED_OUTPATIENT_CLINIC_OR_DEPARTMENT_OTHER): Payer: Self-pay | Admitting: Emergency Medicine

## 2022-11-17 ENCOUNTER — Telehealth (HOSPITAL_BASED_OUTPATIENT_CLINIC_OR_DEPARTMENT_OTHER): Payer: Self-pay | Admitting: *Deleted

## 2022-11-17 ENCOUNTER — Emergency Department (HOSPITAL_BASED_OUTPATIENT_CLINIC_OR_DEPARTMENT_OTHER)
Admission: EM | Admit: 2022-11-17 | Discharge: 2022-11-17 | Disposition: A | Discharge status: Home / Self Care | Payer: Medicaid Other | Attending: Emergency Medicine | Admitting: Emergency Medicine

## 2022-11-17 DIAGNOSIS — K429 Umbilical hernia without obstruction or gangrene: Secondary | ICD-10-CM | POA: Diagnosis not present

## 2022-11-17 DIAGNOSIS — N12 Tubulo-interstitial nephritis, not specified as acute or chronic: Secondary | ICD-10-CM | POA: Diagnosis not present

## 2022-11-17 DIAGNOSIS — N200 Calculus of kidney: Secondary | ICD-10-CM | POA: Diagnosis not present

## 2022-11-17 DIAGNOSIS — A599 Trichomoniasis, unspecified: Secondary | ICD-10-CM | POA: Diagnosis not present

## 2022-11-17 DIAGNOSIS — R1031 Right lower quadrant pain: Secondary | ICD-10-CM | POA: Diagnosis present

## 2022-11-17 LAB — COMPREHENSIVE METABOLIC PANEL
ALT: 9 U/L (ref 0–44)
AST: 9 U/L — ABNORMAL LOW (ref 15–41)
Albumin: 4.3 g/dL (ref 3.5–5.0)
Alkaline Phosphatase: 36 U/L — ABNORMAL LOW (ref 38–126)
Anion gap: 8 (ref 5–15)
BUN: 17 mg/dL (ref 6–20)
CO2: 24 mmol/L (ref 22–32)
Calcium: 9.5 mg/dL (ref 8.9–10.3)
Chloride: 109 mmol/L (ref 98–111)
Creatinine, Ser: 0.84 mg/dL (ref 0.44–1.00)
GFR, Estimated: >60 mL/min (ref >60)
Glucose, Bld: 93 mg/dL (ref 70–99)
Potassium: 3.7 mmol/L (ref 3.5–5.1)
Sodium: 141 mmol/L (ref 135–145)
Total Bilirubin: 0.7 mg/dL (ref 0.3–1.2)
Total Protein: 6.6 g/dL (ref 6.5–8.1)

## 2022-11-17 LAB — PREGNANCY, URINE: Preg Test, Ur: NEGATIVE

## 2022-11-17 LAB — CBC
HCT: 43.9 % (ref 36.0–46.0)
Hemoglobin: 15.1 g/dL — ABNORMAL HIGH (ref 12.0–15.0)
MCH: 31.1 pg (ref 26.0–34.0)
MCHC: 34.4 g/dL (ref 30.0–36.0)
MCV: 90.3 fL (ref 80.0–100.0)
Platelets: 233 10*3/uL (ref 150–400)
RBC: 4.86 MIL/uL (ref 3.87–5.11)
RDW: 13 % (ref 11.5–15.5)
WBC: 5.8 10*3/uL (ref 4.0–10.5)
nRBC: 0 % (ref 0.0–0.2)

## 2022-11-17 LAB — URINALYSIS, ROUTINE W REFLEX MICROSCOPIC
Bilirubin Urine: NEGATIVE
Glucose, UA: NEGATIVE mg/dL
Hgb urine dipstick: NEGATIVE
Ketones, ur: NEGATIVE mg/dL
Nitrite: NEGATIVE
Specific Gravity, Urine: 1.028 (ref 1.005–1.030)
pH: 6 (ref 5.0–8.0)

## 2022-11-17 LAB — LIPASE, BLOOD: Lipase: 20 U/L (ref 11–51)

## 2022-11-17 MED ORDER — IOHEXOL 300 MG/ML  SOLN
100.0000 mL | Freq: Once | INTRAMUSCULAR | Status: AC | PRN
  Administered 2022-11-17: 65 mL via INTRAVENOUS

## 2022-11-17 MED ORDER — ONDANSETRON HCL 4 MG/2ML IJ SOLN
4.0000 mg | Freq: Once | INTRAMUSCULAR | Status: AC | PRN
  Administered 2022-11-17: 4 mg via INTRAVENOUS
  Filled 2022-11-17: qty 2

## 2022-11-17 MED ORDER — CEFDINIR 300 MG PO CAPS: 300.0000 mg | ORAL_CAPSULE | Freq: Two times a day (BID) | ORAL | 0 refills | Status: AC

## 2022-11-17 MED ORDER — SODIUM CHLORIDE 0.9 % IV SOLN: INTRAVENOUS | Status: DC | PRN

## 2022-11-17 MED ORDER — SODIUM CHLORIDE 0.9 % IV SOLN
1.0000 g | Freq: Once | INTRAVENOUS | Status: AC
  Administered 2022-11-17: 1 g via INTRAVENOUS
  Filled 2022-11-17: qty 10

## 2022-11-17 MED ORDER — HYDROMORPHONE HCL 1 MG/ML IJ SOLN
1.0000 mg | Freq: Once | INTRAMUSCULAR | Status: AC
  Administered 2022-11-17: 1 mg via INTRAVENOUS
  Filled 2022-11-17: qty 1

## 2022-11-17 MED ORDER — METRONIDAZOLE 500 MG PO TABS: 500.0000 mg | ORAL_TABLET | Freq: Two times a day (BID) | ORAL | 0 refills | Status: AC

## 2022-11-17 MED ORDER — OXYCODONE-ACETAMINOPHEN 5-325 MG PO TABS: 1.0000 | ORAL_TABLET | Freq: Four times a day (QID) | ORAL | 0 refills | Status: AC | PRN

## 2022-11-17 NOTE — Telephone Encounter (Signed)
Post ED Visit - Positive Culture Follow-up: Unsuccessful Patient Follow-up  Culture assessed and recommendations reviewed by:  [x]  [name], Pharm.D. []  Celedonio Miyamoto, Pharm.D., BCPS AQ-ID []  Garvin Fila, Pharm.D., BCPS []  Georgina Pillion, [address].D., BCPS []  [place], [address].D., BCPS, AAHIVP []  Estella Husk, Pharm.D., BCPS, AAHIVP []  Sherlynn Carbon, PharmD []  Pollyann Samples, PharmD, BCPS  Positive urine culture  [x]  Patient discharged without antimicrobial prescription and treatment is now indicated []  Organism is resistant to prescribed ED discharge antimicrobial []  Patient with positive blood cultures  Plan:  If symptomatic, come in for evaluation.  If no symptoms, start Ciproflox 500mg  PO BID x 7 days, Arby Barrette, MD  Unable to contact patient after 3 attempts, letter will be sent to address on file  Lysle Pearl 11/17/2022, 8:13 AM

## 2022-11-17 NOTE — Telephone Encounter (Signed)
Spoke with pt who states that she is symptomatic with UTI symptoms.  Advised to return to ED for further evaulation per Dr. Arby Barrette instruction.  Pt verbalized understanding.

## 2022-11-17 NOTE — ED Provider Notes (Signed)
[place] EMERGENCY DEPARTMENT AT [facility] Provider Note   CSN: [id number] Arrival date & time: 11/17/22  0845     History  Chief Complaint  Patient presents with   Abdominal Pain    MAEDELL BLAUSER is a 42 y.o. female with past medical history seen for kidney stones, previous pyelonephritis who returns with ongoing lower quadrant abdominal pain.  Pain is not improving.  She left without being seen a few days ago, at that time with an equivocal urine, urine culture at this time has grown out Citrobacter, does seem consistent with a true urinary tract infection, with the degree of abdominal pain, patient reported fevers suspicious for pyelonephritis.  She reports pain not improving with Tylenol but fevers have improved at home.  Concern raised at her previous emergency department visit for appendicitis, patient left without being seen due to a family emergency but as she continues to have same pain concern for similar diagnoses remains.  She endorses some intermittent nausea, she denies vomiting, diarrhea, constipation.  Previous C-section, D&C of uterus, tubal ligation, no other intra-abdominal surgeries noted   Abdominal Pain      Home Medications Prior to Admission medications   Medication Sig Start Date End Date Taking? Authorizing Provider  cefdinir (OMNICEF) 300 MG capsule Take 1 capsule (300 mg total) by mouth 2 (two) times daily. 11/17/22  Yes Mordche Hedglin H, PA-C  metroNIDAZOLE (FLAGYL) 500 MG tablet Take 1 tablet (500 mg total) by mouth 2 (two) times daily. 11/17/22  Yes Kerri-Anne Haeberle H, PA-C  oxyCODONE-acetaminophen (PERCOCET/ROXICET) 5-325 MG tablet Take 1 tablet by mouth every 6 (six) hours as needed for severe pain. 11/17/22  Yes Jozef Eisenbeis H, PA-C  doxycycline (VIBRAMYCIN) 100 MG capsule Take 1 capsule (100 mg total) by mouth 2 (two) times daily. 07/23/22   Renne Crigler, PA-C  ibuprofen (ADVIL) 800 MG tablet Take 1 tablet (800 mg total) by  mouth 3 (three) times daily. 06/04/22   Redwine, Madison A, PA-C  ondansetron (ZOFRAN) 4 MG tablet Take 1 tablet (4 mg total) by mouth every 6 (six) hours. 06/04/22   Redwine, Madison A, PA-C      Allergies    Black walnut pollen allergy skin test and Penicillins    Review of Systems   Review of Systems  Gastrointestinal:  Positive for abdominal pain.  All other systems reviewed and are negative.   Physical Exam Updated Vital Signs BP 101/77   Pulse (!) 54   Temp 98.3 F (36.8 C) (Oral)   Resp 15   SpO2 96%  Physical Exam Vitals and nursing note reviewed.  Constitutional:      General: She is not in acute distress.    Appearance: Normal appearance.     Comments: On initial examination patient is curled up in fetal position on the bed, appearing uncomfortable, but not toxic, nonseptic appearing  HENT:     Head: Normocephalic and atraumatic.  Eyes:     General:        Right eye: No discharge.        Left eye: No discharge.  Cardiovascular:     Rate and Rhythm: Normal rate and regular rhythm.     Heart sounds: No murmur heard.    No friction rub. No gallop.  Pulmonary:     Effort: Pulmonary effort is normal.     Breath sounds: Normal breath sounds.  Abdominal:     General: Bowel sounds are normal.     Palpations: Abdomen  is soft.     Comments: Patient with focal tenderness in the right lower quadrant left lower quadrant, radiation towards right flank, positive CVA tenderness on right.  Skin:    General: Skin is warm and dry.     Capillary Refill: Capillary refill takes less than 2 seconds.  Neurological:     Mental Status: She is alert and oriented to person, place, and time.  Psychiatric:        Mood and Affect: Mood normal.        Behavior: Behavior normal.     ED Results / Procedures / Treatments   Labs (all labs ordered are listed, but only abnormal results are displayed) Labs Reviewed  COMPREHENSIVE METABOLIC PANEL - Abnormal; Notable for the following  components:      Result Value   AST 9 (*)    Alkaline Phosphatase 36 (*)    All other components within normal limits  CBC - Abnormal; Notable for the following components:   Hemoglobin 15.1 (*)    All other components within normal limits  URINALYSIS, ROUTINE W REFLEX MICROSCOPIC - Abnormal; Notable for the following components:   APPearance HAZY (*)    Protein, ur TRACE (*)    Leukocytes,Ua SMALL (*)    Bacteria, UA MANY (*)    Trichomonas, UA PRESENT (*)    All other components within normal limits  LIPASE, BLOOD  PREGNANCY, URINE    EKG None  Radiology CT ABDOMEN PELVIS W CONTRAST  Result Date: 11/17/2022 CLINICAL DATA:  Pain right lower quadrant of abdomen EXAM: CT ABDOMEN AND PELVIS WITH CONTRAST TECHNIQUE: Multidetector CT imaging of the abdomen and pelvis was performed using the standard protocol following bolus administration of intravenous contrast. RADIATION DOSE REDUCTION: This exam was performed according to the departmental dose-optimization program which includes automated exposure control, adjustment of the mA and/or kV according to patient size and/or use of iterative reconstruction technique. CONTRAST:  65mL OMNIPAQUE IOHEXOL 300 MG/ML  SOLN COMPARISON:  06/04/2022 FINDINGS: Lower chest: Small linear densities in the lower lung fields may suggest minimal scarring or subsegmental atelectasis. Hepatobiliary: There is no dilation of bile ducts. Gallbladder is unremarkable. Pancreas: No focal abnormalities are seen. Spleen: Unremarkable. Adrenals/Urinary Tract: Adrenals are unremarkable. There is no hydronephrosis. There is 3 mm calculus in the lower pole of right kidney. Ureters are unremarkable. Urinary bladder is not distended. Stomach/Bowel: Stomach is unremarkable. Small bowel loops are not dilated. Cecum is lower than usual in pelvis. Appendix is noted in pelvic cavity. Appendix is not dilated. There is no significant wall thickening in colon. There is no pericolic  stranding. Vascular/Lymphatic: Unremarkable. Reproductive: There is a 2.4 cm fluid density structure in the left adnexa, possibly dominant follicle or functional cyst in the ovary. Other: There is no ascites or pneumoperitoneum. Umbilical hernia containing fat is seen. Musculoskeletal: No acute findings are seen. IMPRESSION: There is no evidence of intestinal obstruction or pneumoperitoneum. Appendix is not dilated. There is no hydronephrosis. There is 3 mm right renal calculus. There is 2.4 cm fluid density structure in the left adnexa, possibly a follicle or functional cyst in the left ovary. Small linear densities in the posterior lower lung fields may suggest minimal scarring or minimal subsegmental atelectasis. Umbilical hernia containing fat is seen. Electronically Signed   By: Ernie Avena M.D.   On: 11/17/2022 11:13    Procedures Procedures    Medications Ordered in ED Medications  0.9 %  sodium chloride infusion (0 mLs Intravenous Stopped 11/17/22  1121)  cefTRIAXone (ROCEPHIN) 1 g in sodium chloride 0.9 % 100 mL IVPB (0 g Intravenous Stopped 11/17/22 1010)  HYDROmorphone (DILAUDID) injection 1 mg (1 mg Intravenous Given 11/17/22 0931)  ondansetron (ZOFRAN) injection 4 mg (4 mg Intravenous Given 11/17/22 0932)  iohexol (OMNIPAQUE) 300 MG/ML solution 100 mL (65 mLs Intravenous Contrast Given 11/17/22 1032)    ED Course/ Medical Decision Making/ A&P Clinical Course as of 11/17/22 1242  Mon Nov 17, 2022  0910 41yo female, hx of kidney stones, pyelonephritis. Had to be hospitalized previously for pyelo with IV abx. Focal ttp in the lower quadrants, most focally in RLQ. Pain not improving. LWBS a few days ago. Sharp pain, decreased appetite, nausea. Does endorse cloudy urine. Ttp in RLQ and LLQ -- no guarding, rebound. Positive CVA on right.  [CP]    Clinical Course User Index [CP] Olene Floss, PA-C                             Medical Decision Making Amount and/or Complexity  of Data Reviewed Labs: ordered. Radiology: ordered.  Risk Prescription drug management.   This patient is a 42 y.o. female  who presents to the ED for concern of ongoing dysuria, lower abdominal pain, right-sided flank pain, symptoms been ongoing since she was seen in the ED on 6/14 but she had to leave AGAINST MEDICAL ADVICE at that time due to a family emergency.   Differential diagnoses prior to evaluation: The emergent differential diagnosis includes, but is not limited to,  The causes of generalized abdominal pain include but are not limited to AAA, mesenteric ischemia, appendicitis, diverticulitis, DKA, gastritis, gastroenteritis, AMI, nephrolithiasis, pancreatitis, peritonitis, adrenal insufficiency,lead poisoning, iron toxicity, intestinal ischemia, constipation, UTI,SBO/LBO, splenic rupture, biliary disease, IBD, IBS, PUD, or hepatitis.  Some increased concern for appendicitis based on previous provider note, focal right lower quadrant tenderness.  Additionally I reviewed her recent records, she does have clinical signs and symptoms of pyelonephritis with culture positive urinary tract infection, ongoing dysuria, cloudy urine, and flank pain as well as reported fever, although she is afebrile in the emergency department.. This is not an exhaustive differential.   Past Medical History / Co-morbidities / Social History: Previous kidney stones, C-section, D&C of uterus, tubal ligation, otherwise without significant past medical history  Additional history: Chart reviewed. Pertinent results include: Reviewed lab work, and plan from patient's emergency department visit on 6/14  Physical Exam: Physical exam performed. The pertinent findings include: Patient with focal tenderness in the right lower quadrant left lower quadrant, radiation towards right flank, positive CVA tenderness on right.   Vital signs are stable in the emergency department, and no fever, no tachycardia, normotensive blood  pressures.  Lab Tests/Imaging studies: I personally interpreted labs/imaging and the pertinent results include: UA again with some squamous contamination, but small leukocytes, 21-50 white blood cells and many bacteria noted.  Along with her culture from 3 days ago I do have high clinical suspicion of a true urinary tract infection with progression of pyelonephritis.  CBC notably without significant leukocytosis, mildly elevated hemoglobin likely secondary to hemoconcentration, we will replete fluids.  Lipase normal, pregnancy test negative, CMP overall unremarkable.  [place] interpreted CT abdomen pelvis which shows 3 mm stone in the right kidney without hydronephrosis or ureteral stone, unlikely to be contributing to patient's pain, additionally with a 2.4 cm fluid and structure in left adnexa likely representative of cyst.  Encouraged OB/GYN follow-up for  cyst, no CT findings to explain patient's symptoms at this time overall.  I agree with the radiologist interpretation.   Medications: I ordered medication including Dilaudid for pain, Zofran for nausea, fluid bolus for dehydration, Rocephin for pyelonephritis.  Will discharge patient on cefdinir for pyelonephritis, short prescription pain medication due to the severity of her pain and encourage close PCP follow-up, she does not meet admission criteria at this time as she is able to tolerate pain for discharge home, she is afebrile and without leukocytosis..  I have reviewed the patients home medicines and have made adjustments as needed.   Disposition: After consideration of the diagnostic results and the patients response to treatment, I feel that patient is stable for discharge with plan as above, symptoms consistent with pyelonephritis, additionally urine does show trichomonas, will treat for trichomoniasis, gave patient expedited partner therapy as she reports only 1 sexual partner at this time, 2x metronidazole administered.   emergency department  workup does not suggest an emergent condition requiring admission or immediate intervention beyond what has been performed at this time. The plan is: as above. The patient is safe for discharge and has been instructed to return immediately for worsening symptoms, change in symptoms or any other concerns.  Final Clinical Impression(s) / ED Diagnoses Final diagnoses:  Trichomoniasis  Pyelonephritis    Rx / DC Orders ED Discharge Orders          Ordered    cefdinir (OMNICEF) 300 MG capsule  2 times daily        11/17/22 1142    metroNIDAZOLE (FLAGYL) 500 MG tablet  2 times daily        11/17/22 1142    oxyCODONE-acetaminophen (PERCOCET/ROXICET) 5-325 MG tablet  Every 6 hours PRN        11/17/22 1142              Eddy Liszewski, [place] H, PA-C 11/17/22 1242    Long, Arlyss Repress, MD 11/21/22 [phone]

## 2022-11-17 NOTE — Discharge Instructions (Addendum)
Please use Tylenol or ibuprofen for pain.  You may use 600 mg ibuprofen every 6 hours or 1000 mg of Tylenol every 6 hours.  You may choose to alternate between the 2.  This would be most effective.  Not to exceed 4 g of Tylenol within 24 hours.  Not to exceed 3200 mg ibuprofen 24 hours.  The medication for the trichomoniasis infection is the metronidazole (Flagyl).  I prescribed you 28 tablets for expedited partner therapy, take 1 tablet by mouth twice daily for 1 week, the 28 tablets will give you enough for your partner to take the medication for 1 week as well.  Is important to note that this medication cannot be taken with alcohol as it will give you significant side effects of feeling like you are going through alcohol withdrawal.

## 2022-11-17 NOTE — Progress Notes (Signed)
ED Antimicrobial Stewardship Positive Culture Follow Up   Tonya [name] is an 42 y.o. female who presented to [facility] on [facility]/14/2022 with a chief complaint of  Chief Complaint  Patient presents with   Back Pain    Recent Results (from the past 720 hour(s))  Urine Culture     Status: Abnormal   Collection Time: 11/14/22  4:29 PM   Specimen: Urine, Clean Catch  Result Value Ref Range Status   Specimen Description   Final    URINE, CLEAN CATCH Performed at [facility], [address], [place], [place] [address]    Special Requests   Final    NONE Performed at [facility], [address] [place], [place] [address]    Culture >=100,000 COLONIES/mL CITROBACTER KOSERI (A)  Final   Report Status 11/16/2022 FINAL  Final   Organism ID, Bacteria CITROBACTER KOSERI (A)  Final      Susceptibility   Citrobacter koseri - MIC*    CEFEPIME <=0.12 SENSITIVE Sensitive     CEFTRIAXONE <=0.25 SENSITIVE Sensitive     CIPROFLOXACIN <=0.25 SENSITIVE Sensitive     GENTAMICIN <=1 SENSITIVE Sensitive     IMIPENEM <=0.25 SENSITIVE Sensitive     NITROFURANTOIN 32 SENSITIVE Sensitive     TRIMETH/SULFA <=20 SENSITIVE Sensitive     PIP/TAZO <=4 SENSITIVE Sensitive     * >=100,000 COLONIES/mL CITROBACTER KOSERI    [x]  Patient discharged originally without antimicrobial agent and treatment is now indicated  Call patient for symptom check, if symptomatic, recommend coming back to ED for further evaluation. If no severe symptoms, recommend starting the new antibiotic below.   New antibiotic prescription: Ciprofloxacin 500 mg po BID x7 days   ED Provider: Arby Barrette, MD   [name] 11/17/2022, 7:40 AM Clinical Pharmacist Monday - Friday phone -  [phone] Saturday - Sunday phone - [phone]

## 2022-11-17 NOTE — ED Triage Notes (Signed)
Pt arrives to ED with c/o right lower abdominal pain and right sided flank pain. Was seen on 6/14 but left AMA.

## 2022-11-18 ENCOUNTER — Telehealth: Payer: Self-pay | Admitting: Obstetrics and Gynecology

## 2022-11-18 NOTE — Transitions of Care (Post Inpatient/ED Visit) (Signed)
   11/18/2022  Name: Tonya [name] MRN: [id number] DOB: 16-Apr-1981  Today's TOC FU Call Status: Today's TOC FU Call Status:: Unsuccessul Call (1st Attempt) Unsuccessful Call (1st Attempt) Date: 11/18/22  Attempted to reach the patient regarding the most recent Inpatient/ED visit.  Follow Up Plan: Additional outreach attempts will be made to reach the patient to complete the Transitions of Care (Post Inpatient/ED visit) call.   Kathi Der RN, BSN [place]  [employer] - Managed Medicaid High Risk [phone]

## 2022-12-14 DIAGNOSIS — M5431 Sciatica, right side: Secondary | ICD-10-CM | POA: Diagnosis not present

## 2022-12-14 DIAGNOSIS — M545 Low back pain, unspecified: Secondary | ICD-10-CM | POA: Diagnosis not present

## 2022-12-19 DIAGNOSIS — H00012 Hordeolum externum right lower eyelid: Secondary | ICD-10-CM | POA: Diagnosis not present

## 2022-12-19 DIAGNOSIS — N611 Abscess of the breast and nipple: Secondary | ICD-10-CM | POA: Diagnosis not present

## 2023-02-10 ENCOUNTER — Encounter (HOSPITAL_BASED_OUTPATIENT_CLINIC_OR_DEPARTMENT_OTHER): Payer: Self-pay | Admitting: Emergency Medicine

## 2023-02-10 ENCOUNTER — Emergency Department (HOSPITAL_BASED_OUTPATIENT_CLINIC_OR_DEPARTMENT_OTHER)
Admission: EM | Admit: 2023-02-10 | Discharge: 2023-02-10 | Disposition: A | Discharge status: Home / Self Care | Payer: Medicaid Other | Attending: Emergency Medicine | Admitting: Emergency Medicine

## 2023-02-10 ENCOUNTER — Other Ambulatory Visit: Payer: Self-pay

## 2023-02-10 ENCOUNTER — Emergency Department (HOSPITAL_BASED_OUTPATIENT_CLINIC_OR_DEPARTMENT_OTHER): Payer: Medicaid Other

## 2023-02-10 DIAGNOSIS — R0981 Nasal congestion: Secondary | ICD-10-CM | POA: Diagnosis present

## 2023-02-10 DIAGNOSIS — R059 Cough, unspecified: Secondary | ICD-10-CM | POA: Diagnosis not present

## 2023-02-10 DIAGNOSIS — F17210 Nicotine dependence, cigarettes, uncomplicated: Secondary | ICD-10-CM | POA: Insufficient documentation

## 2023-02-10 DIAGNOSIS — B349 Viral infection, unspecified: Secondary | ICD-10-CM | POA: Insufficient documentation

## 2023-02-10 DIAGNOSIS — R0789 Other chest pain: Secondary | ICD-10-CM | POA: Diagnosis not present

## 2023-02-10 DIAGNOSIS — Z1152 Encounter for screening for COVID-19: Secondary | ICD-10-CM | POA: Diagnosis not present

## 2023-02-10 LAB — URINALYSIS, W/ REFLEX TO CULTURE (INFECTION SUSPECTED)
Bilirubin Urine: NEGATIVE
Glucose, UA: NEGATIVE mg/dL
Hgb urine dipstick: NEGATIVE
Ketones, ur: NEGATIVE mg/dL
Leukocytes,Ua: NEGATIVE
Nitrite: NEGATIVE
Protein, ur: NEGATIVE mg/dL
Specific Gravity, Urine: 1.023 (ref 1.005–1.030)
pH: 6 (ref 5.0–8.0)

## 2023-02-10 LAB — PREGNANCY, URINE: Preg Test, Ur: NEGATIVE

## 2023-02-10 LAB — CBC
HCT: 41.1 % (ref 36.0–46.0)
Hemoglobin: 14 g/dL (ref 12.0–15.0)
MCH: 32.4 pg (ref 26.0–34.0)
MCHC: 34.1 g/dL (ref 30.0–36.0)
MCV: 95.1 fL (ref 80.0–100.0)
Platelets: 192 10*3/uL (ref 150–400)
RBC: 4.32 MIL/uL (ref 3.87–5.11)
RDW: 12.6 % (ref 11.5–15.5)
WBC: 5.4 10*3/uL (ref 4.0–10.5)
nRBC: 0 % (ref 0.0–0.2)

## 2023-02-10 LAB — COMPREHENSIVE METABOLIC PANEL
ALT: 12 U/L (ref 0–44)
AST: 17 U/L (ref 15–41)
Albumin: 3.9 g/dL (ref 3.5–5.0)
Alkaline Phosphatase: 37 U/L — ABNORMAL LOW (ref 38–126)
Anion gap: 8 (ref 5–15)
BUN: 15 mg/dL (ref 6–20)
CO2: 21 mmol/L — ABNORMAL LOW (ref 22–32)
Calcium: 8.2 mg/dL — ABNORMAL LOW (ref 8.9–10.3)
Chloride: 111 mmol/L (ref 98–111)
Creatinine, Ser: 0.76 mg/dL (ref 0.44–1.00)
GFR, Estimated: >60 mL/min (ref >60)
Glucose, Bld: 88 mg/dL (ref 70–99)
Potassium: 4.4 mmol/L (ref 3.5–5.1)
Sodium: 140 mmol/L (ref 135–145)
Total Bilirubin: 0.5 mg/dL (ref 0.3–1.2)
Total Protein: 6.3 g/dL — ABNORMAL LOW (ref 6.5–8.1)

## 2023-02-10 LAB — RESP PANEL BY RT-PCR (RSV, FLU A&B, COVID)  RVPGX2
Influenza A by PCR: NEGATIVE
Influenza B by PCR: NEGATIVE
Resp Syncytial Virus by PCR: NEGATIVE
SARS Coronavirus 2 by RT PCR: NEGATIVE

## 2023-02-10 MED ORDER — ONDANSETRON HCL 4 MG/2ML IJ SOLN
4.0000 mg | Freq: Once | INTRAMUSCULAR | Status: AC
  Administered 2023-02-10: 4 mg via INTRAVENOUS
  Filled 2023-02-10: qty 2

## 2023-02-10 MED ORDER — ACETAMINOPHEN 325 MG PO TABS
650.0000 mg | ORAL_TABLET | Freq: Once | ORAL | Status: AC
  Administered 2023-02-10: 650 mg via ORAL
  Filled 2023-02-10: qty 2

## 2023-02-10 MED ORDER — LACTATED RINGERS IV BOLUS
1000.0000 mL | Freq: Once | INTRAVENOUS | Status: AC
  Administered 2023-02-10: 1000 mL via INTRAVENOUS

## 2023-02-10 NOTE — ED Provider Notes (Signed)
[place] EMERGENCY DEPARTMENT AT [facility] Provider Note   CSN: [id number] Arrival date & time: 02/10/23  2440     History  Chief Complaint  Patient presents with   Flank Pain    Tonya [name] is a 42 y.o. female.  Patient is a 42 year old female with history of kidney stones, prior UTI/pyelonephritis, tobacco use who is presenting today with multiple complaints.  Patient reports that on Saturday she started feeling unwell.  Her symptoms included myalgias, malaise, temperatures up to 101, nasal congestion, chest tightness.  She then started noticing some right-sided back/flank discomfort.  She has not had significant dysuria but noticed a strong smell to her urine that started slightly before she started feeling unwell.  She started having vomiting yesterday and last noted fever was last night before she went to bed.  She has not had diarrhea or productive cough.  She noticed this morning also she was having irritation and discomfort in her left eye.  Her kids did go back to school and everyone has had some URI symptoms.    The history is provided by the patient.  Flank Pain       Home Medications Prior to Admission medications   Medication Sig Start Date End Date Taking? Authorizing Provider  cefdinir (OMNICEF) 300 MG capsule Take 1 capsule (300 mg total) by mouth 2 (two) times daily. 11/17/22   Prosperi, Christian H, PA-C  doxycycline (VIBRAMYCIN) 100 MG capsule Take 1 capsule (100 mg total) by mouth 2 (two) times daily. 07/23/22   Renne Crigler, PA-C  ibuprofen (ADVIL) 800 MG tablet Take 1 tablet (800 mg total) by mouth 3 (three) times daily. 06/04/22   Redwine, Madison A, PA-C  metroNIDAZOLE (FLAGYL) 500 MG tablet Take 1 tablet (500 mg total) by mouth 2 (two) times daily. 11/17/22   Prosperi, Christian H, PA-C  ondansetron (ZOFRAN) 4 MG tablet Take 1 tablet (4 mg total) by mouth every 6 (six) hours. 06/04/22   Redwine, Madison A, PA-C  oxyCODONE-acetaminophen  (PERCOCET/ROXICET) 5-325 MG tablet Take 1 tablet by mouth every 6 (six) hours as needed for severe pain. 11/17/22   Prosperi, Christian H, PA-C      Allergies    Black walnut pollen allergy skin test and Penicillins    Review of Systems   Review of Systems  Genitourinary:  Positive for flank pain.    Physical Exam Updated Vital Signs BP 132/84 (BP Location: Right Arm)   Pulse 62   Temp 98.3 F (36.8 C)   Resp 18   Ht 5\' 3"  (1.6 m)   Wt 68 kg   SpO2 100%   BMI 26.57 kg/m  Physical Exam Vitals and nursing note reviewed.  Constitutional:      General: She is not in acute distress.    Appearance: She is well-developed.  HENT:     Head: Normocephalic and atraumatic.     Right Ear: Tympanic membrane normal.     Left Ear: A middle ear effusion is present.     Nose: Mucosal edema present.     Left Sinus: Maxillary sinus tenderness and frontal sinus tenderness present.     Mouth/Throat:     Mouth: Mucous membranes are dry.     Pharynx: Oropharynx is clear.  Eyes:     Conjunctiva/sclera: Conjunctivae normal.     Pupils: Pupils are equal, round, and reactive to light.     Comments: Erythema noted to the left lower eyelid with minimal swelling but no discharge  Cardiovascular:     Rate and Rhythm: Normal rate and regular rhythm.     Heart sounds: No murmur heard. Pulmonary:     Effort: Pulmonary effort is normal. No respiratory distress.     Breath sounds: Normal breath sounds. No wheezing or rales.  Abdominal:     General: There is no distension.     Palpations: Abdomen is soft.     Tenderness: There is no abdominal tenderness. There is right CVA tenderness. There is no guarding or rebound.  Musculoskeletal:        General: No tenderness. Normal range of motion.     Cervical back: Normal range of motion and neck supple.  Skin:    General: Skin is warm and dry.     Findings: No erythema or rash.  Neurological:     Mental Status: She is alert and oriented to person, place,  and time.  Psychiatric:        Behavior: Behavior normal.     ED Results / Procedures / Treatments   Labs (all labs ordered are listed, but only abnormal results are displayed) Labs Reviewed  COMPREHENSIVE METABOLIC PANEL - Abnormal; Notable for the following components:      Result Value   CO2 21 (*)    Calcium 8.2 (*)    Total Protein 6.3 (*)    Alkaline Phosphatase 37 (*)    All other components within normal limits  URINALYSIS, W/ REFLEX TO CULTURE (INFECTION SUSPECTED) - Abnormal; Notable for the following components:   Bacteria, UA RARE (*)    All other components within normal limits  RESP PANEL BY RT-PCR (RSV, FLU A&B, COVID)  RVPGX2  PREGNANCY, URINE  CBC  GC/CHLAMYDIA PROBE AMP ([place]) NOT AT [facility]    EKG None  Radiology DG Chest Port 1 View  Result Date: 02/10/2023 CLINICAL DATA:  Chest tightness and cough EXAM: PORTABLE CHEST 1 VIEW COMPARISON:  01/11/2020 FINDINGS: Cardiac and mediastinal contours are within normal limits. No focal pulmonary opacity. No pleural effusion or pneumothorax. No acute osseous abnormality. IMPRESSION: No acute cardiopulmonary process. Electronically Signed   By: Wiliam Ke M.D.   On: 02/10/2023 09:59    Procedures Procedures    Medications Ordered in ED Medications  ondansetron (ZOFRAN) injection 4 mg (4 mg Intravenous Given 02/10/23 0915)  acetaminophen (TYLENOL) tablet 650 mg (650 mg Oral Given 02/10/23 0917)  lactated ringers bolus 1,000 mL (1,000 mLs Intravenous New Bag/Given 02/10/23 0917)    ED Course/ Medical Decision Making/ A&P                                 Medical Decision Making Amount and/or Complexity of Data Reviewed Labs: ordered. Decision-making details documented in ED Course. Radiology: ordered and independent interpretation performed. Decision-making details documented in ED Course.  Risk OTC drugs. Prescription drug management.   Pt with multiple medical problems and comorbidities and  presenting today with a complaint that caries a high risk for morbidity and mortality.  Here today with multiple complaints.  Concern for possible viral etiology causing the patient's symptoms as she does have sick contacts at home however also concern for possible pyelonephritis.  Patient does report a recent history of few months ago of trichomonas as well which she was treated for as well as her partner but she wants to ensure there is no sign of infection today.  She denies any vaginal discharge, burning or  itching.  Low suspicion for appendicitis, diverticulitis, cholecystitis today.  10:08 AM I independently interpreted patient's labs and COVID, CMP, CBC, UA and urine pregnancy test are all within normal limits.  I have independently visualized and interpreted pt's images today.  Chest x-ray today without acute findings.  Feel that patient's symptoms are viral in nature.  Vital signs are normal.  Findings discussed with the patient.  At this time no indication for antibiotics.  Will continue supportive care at home and time.  Patient is comfortable with this plan.          Final Clinical Impression(s) / ED Diagnoses Final diagnoses:  Acute viral syndrome    Rx / DC Orders ED Discharge Orders     None         Gwyneth Sprout, MD 02/10/23 1554

## 2023-02-10 NOTE — ED Notes (Signed)
Pt discharged home and given discharge paperwork. Opportunities given for questions. Pt verbalizes understanding. PIV removed x1. Stone,Heather R , RN 

## 2023-02-10 NOTE — Discharge Instructions (Signed)
The x-ray and all the blood work today were normal.  Continue Tylenol or Motrin as needed for fever.  Drink plenty of fluids and get lots of rest.  You could also try Sudafed, Nettie pot or nasal saline spray to help with the sinuses as well as warm and cool compresses for your eye and artificial tears.

## 2023-02-10 NOTE — ED Triage Notes (Signed)
Pt arrives to ED with c/o right sided flank pain x3 days.  

## 2023-02-11 LAB — GC/CHLAMYDIA PROBE AMP (~~LOC~~) NOT AT ARMC
Chlamydia: NEGATIVE
Comment: NEGATIVE
Comment: NORMAL
Neisseria Gonorrhea: NEGATIVE

## 2023-03-31 DIAGNOSIS — S93601A Unspecified sprain of right foot, initial encounter: Secondary | ICD-10-CM | POA: Diagnosis not present

## 2023-03-31 DIAGNOSIS — M79671 Pain in right foot: Secondary | ICD-10-CM | POA: Diagnosis not present

## 2023-06-14 DIAGNOSIS — Z3202 Encounter for pregnancy test, result negative: Secondary | ICD-10-CM | POA: Diagnosis not present

## 2023-06-14 DIAGNOSIS — N898 Other specified noninflammatory disorders of vagina: Secondary | ICD-10-CM | POA: Diagnosis not present

## 2023-06-14 DIAGNOSIS — N760 Acute vaginitis: Secondary | ICD-10-CM | POA: Diagnosis not present

## 2023-06-14 DIAGNOSIS — B9689 Other specified bacterial agents as the cause of diseases classified elsewhere: Secondary | ICD-10-CM | POA: Diagnosis not present

## 2023-08-17 DIAGNOSIS — R051 Acute cough: Secondary | ICD-10-CM | POA: Diagnosis not present

## 2023-08-17 DIAGNOSIS — H66002 Acute suppurative otitis media without spontaneous rupture of ear drum, left ear: Secondary | ICD-10-CM | POA: Diagnosis not present

## 2023-08-17 DIAGNOSIS — U071 COVID-19: Secondary | ICD-10-CM | POA: Diagnosis not present

## 2023-08-17 DIAGNOSIS — R0981 Nasal congestion: Secondary | ICD-10-CM | POA: Diagnosis not present

## 2023-09-22 DIAGNOSIS — R1084 Generalized abdominal pain: Secondary | ICD-10-CM | POA: Diagnosis not present

## 2023-09-22 DIAGNOSIS — N926 Irregular menstruation, unspecified: Secondary | ICD-10-CM | POA: Diagnosis not present

## 2023-09-22 DIAGNOSIS — R1030 Lower abdominal pain, unspecified: Secondary | ICD-10-CM | POA: Diagnosis not present

## 2023-09-24 DIAGNOSIS — N83202 Unspecified ovarian cyst, left side: Secondary | ICD-10-CM | POA: Diagnosis not present

## 2023-09-24 DIAGNOSIS — R1030 Lower abdominal pain, unspecified: Secondary | ICD-10-CM | POA: Diagnosis not present

## 2023-09-24 DIAGNOSIS — R1084 Generalized abdominal pain: Secondary | ICD-10-CM | POA: Diagnosis not present

## 2023-11-01 DIAGNOSIS — H00016 Hordeolum externum left eye, unspecified eyelid: Secondary | ICD-10-CM | POA: Diagnosis not present

## 2023-11-02 DIAGNOSIS — Z1231 Encounter for screening mammogram for malignant neoplasm of breast: Secondary | ICD-10-CM | POA: Diagnosis not present

## 2023-11-02 DIAGNOSIS — N920 Excessive and frequent menstruation with regular cycle: Secondary | ICD-10-CM | POA: Diagnosis not present

## 2023-11-02 DIAGNOSIS — Z01419 Encounter for gynecological examination (general) (routine) without abnormal findings: Secondary | ICD-10-CM | POA: Diagnosis not present

## 2023-11-02 DIAGNOSIS — R635 Abnormal weight gain: Secondary | ICD-10-CM | POA: Diagnosis not present

## 2024-03-20 DIAGNOSIS — N898 Other specified noninflammatory disorders of vagina: Secondary | ICD-10-CM | POA: Diagnosis not present

## 2024-03-20 DIAGNOSIS — Z3202 Encounter for pregnancy test, result negative: Secondary | ICD-10-CM | POA: Diagnosis not present

## 2024-04-13 DIAGNOSIS — N644 Mastodynia: Secondary | ICD-10-CM | POA: Diagnosis not present

## 2024-04-13 DIAGNOSIS — N6322 Unspecified lump in the left breast, upper inner quadrant: Secondary | ICD-10-CM | POA: Diagnosis not present
